# Patient Record
Sex: Female | Born: 1960 | Race: White | Hispanic: No | Smoking: Current every day smoker
Health system: Southern US, Community
[De-identification: ages and names within clinical notes are randomized; demographics above are authoritative.]

## PROBLEM LIST (undated history)

## (undated) DIAGNOSIS — K922 Gastrointestinal hemorrhage, unspecified: Secondary | ICD-10-CM

## (undated) DIAGNOSIS — K635 Polyp of colon: Secondary | ICD-10-CM

## (undated) DIAGNOSIS — F1010 Alcohol abuse, uncomplicated: Secondary | ICD-10-CM

## (undated) DIAGNOSIS — I10 Essential (primary) hypertension: Secondary | ICD-10-CM

## (undated) DIAGNOSIS — F17200 Nicotine dependence, unspecified, uncomplicated: Secondary | ICD-10-CM

## (undated) DIAGNOSIS — K5790 Diverticulosis of intestine, part unspecified, without perforation or abscess without bleeding: Secondary | ICD-10-CM

---

## 2000-09-02 ENCOUNTER — Other Ambulatory Visit: Admission: RE | Admit: 2000-09-02 | Discharge: 2000-09-02 | Payer: Self-pay | Admitting: Obstetrics and Gynecology

## 2002-04-21 ENCOUNTER — Other Ambulatory Visit: Admission: RE | Admit: 2002-04-21 | Discharge: 2002-04-21 | Payer: Self-pay | Admitting: Obstetrics & Gynecology

## 2010-02-01 ENCOUNTER — Emergency Department (HOSPITAL_COMMUNITY)
Admission: EM | Admit: 2010-02-01 | Discharge: 2010-02-01 | Payer: Self-pay | Source: Home / Self Care | Admitting: Emergency Medicine

## 2011-01-29 LAB — DIFFERENTIAL
Basophils Absolute: 0.1 10*3/uL (ref 0.0–0.1)
Basophils Relative: 1 % (ref 0–1)
Eosinophils Absolute: 0.1 10*3/uL (ref 0.0–0.7)
Eosinophils Relative: 1 % (ref 0–5)

## 2011-01-29 LAB — URINALYSIS, ROUTINE W REFLEX MICROSCOPIC
Ketones, ur: 15 mg/dL — AB
Leukocytes, UA: NEGATIVE
Nitrite: NEGATIVE
Protein, ur: NEGATIVE mg/dL
Specific Gravity, Urine: 1.018 (ref 1.005–1.030)

## 2011-01-29 LAB — CBC
HCT: 38.1 % (ref 36.0–46.0)
Hemoglobin: 13.2 g/dL (ref 12.0–15.0)
MCV: 99.6 fL (ref 78.0–100.0)
Platelets: 178 10*3/uL (ref 150–400)
WBC: 8.6 10*3/uL (ref 4.0–10.5)

## 2011-01-29 LAB — BASIC METABOLIC PANEL
Chloride: 104 mEq/L (ref 96–112)
Creatinine, Ser: 0.73 mg/dL (ref 0.4–1.2)
Glucose, Bld: 98 mg/dL (ref 70–99)
Potassium: 4.2 mEq/L (ref 3.5–5.1)

## 2011-01-29 LAB — URINE MICROSCOPIC-ADD ON

## 2011-08-23 IMAGING — CT CT HEAD W/O CM
1 of 2 series · 13 of 30 positions shown, 17 images · non-contrast
Comparison: None.

CLINICAL DATA: Weakness, nausea and vomiting

CT HEAD WITHOUT CONTRAST
TECHNIQUE: Contiguous axial images were obtained from the base of
the skull through the vertex without contrast.

[Series 2: brain · axial · 0.47mm/px · z∈[+143,+264]mm · 13 of 28 slices shown, 17 images]
[im 2/28  brain]
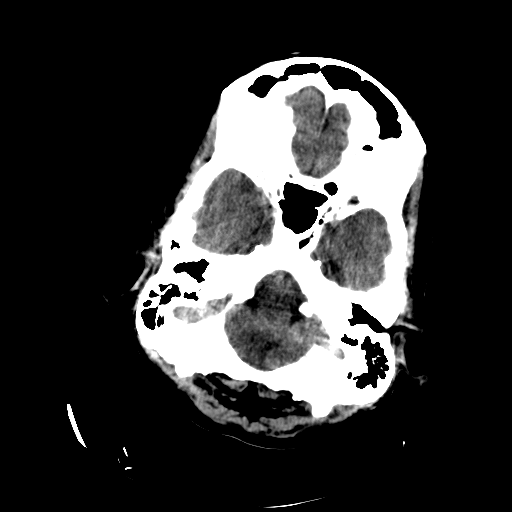
[im 2/28  bone]
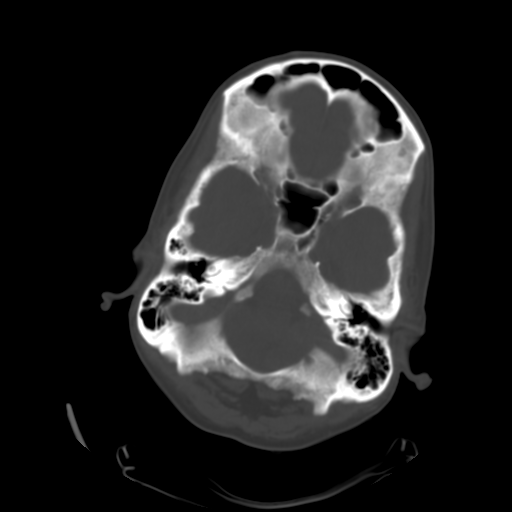
[im 4/28  brain]
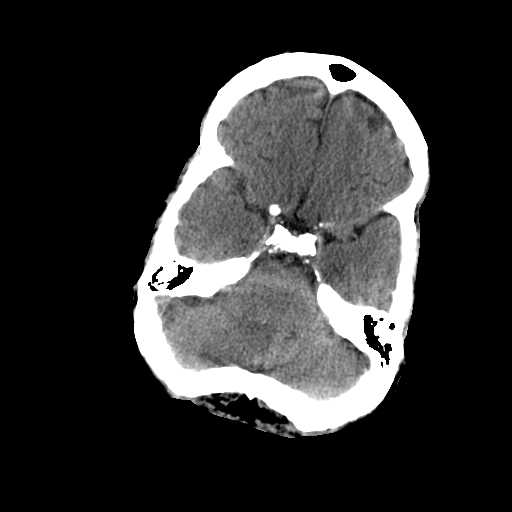
[im 6/28  brain]
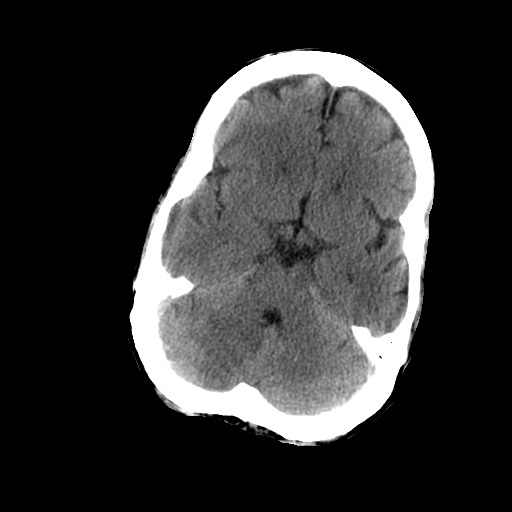
[im 8/28  brain]
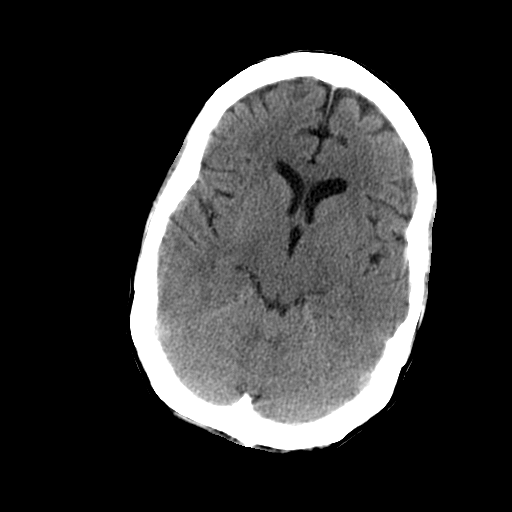
[im 10/28  brain]
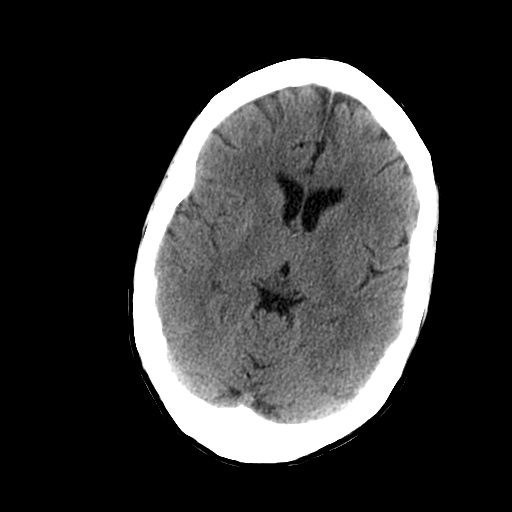
[im 10/28  bone]
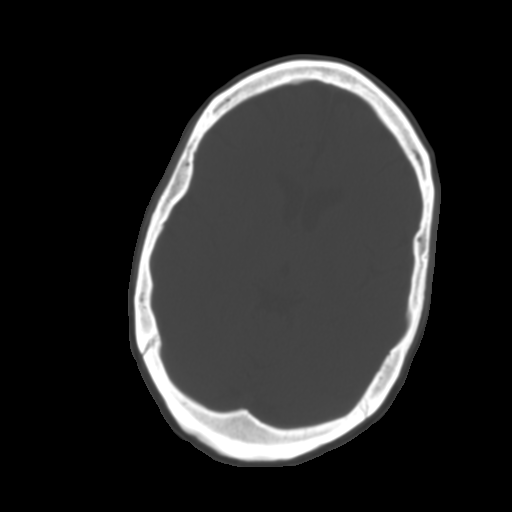
[im 12/28  brain]
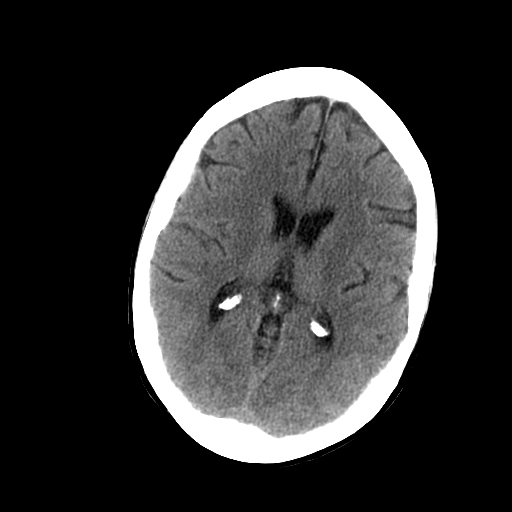
[im 14/28  brain]
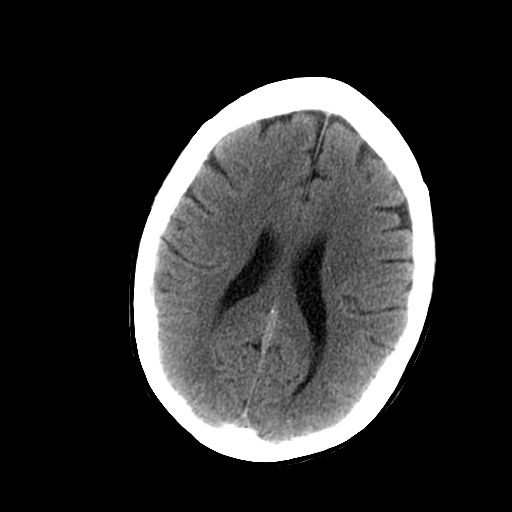
[im 16/28  brain]
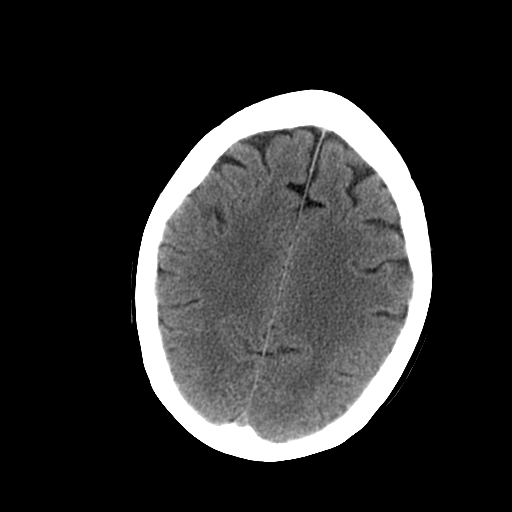
[im 18/28  brain]
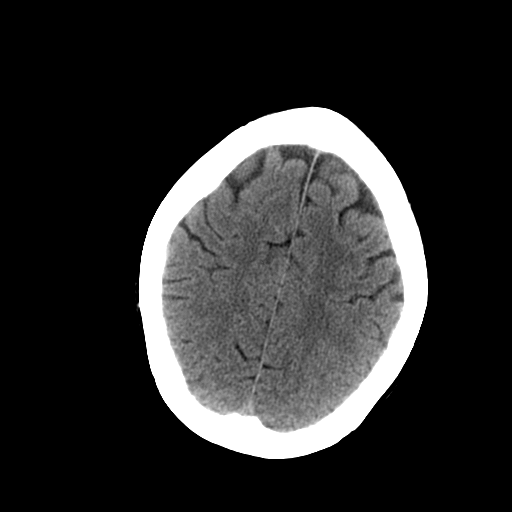
[im 18/28  bone]
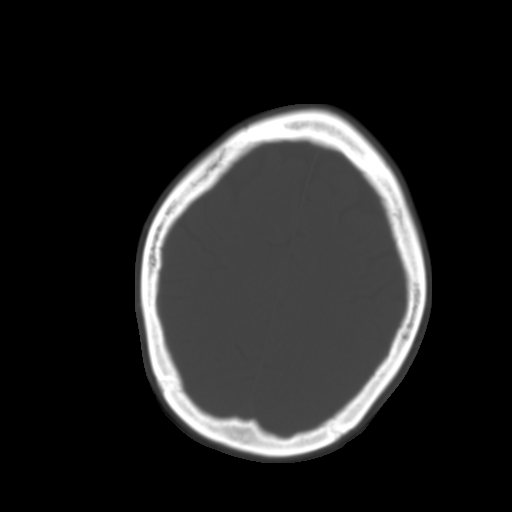
[im 20/28  brain]
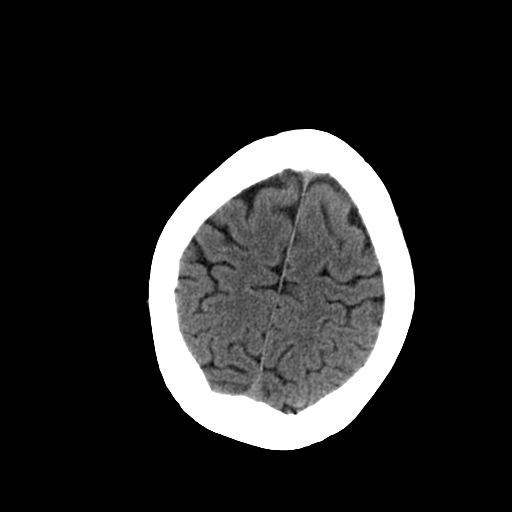
[im 22/28  brain]
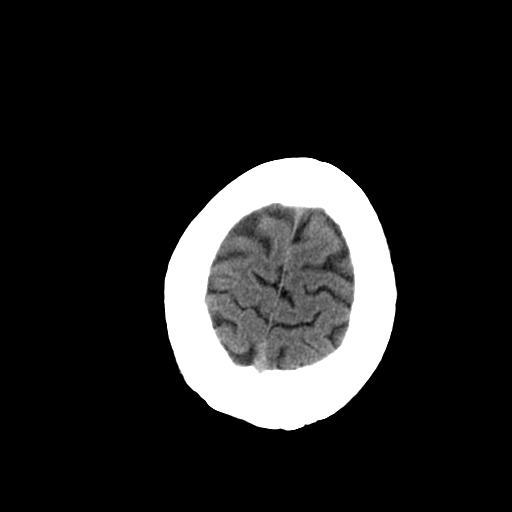
[im 24/28  brain]
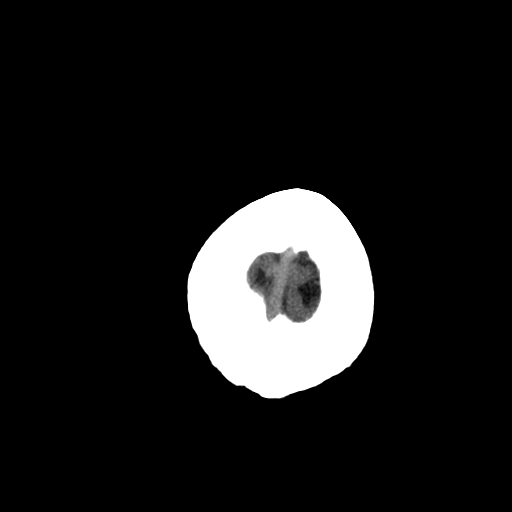
[im 26/28  brain]
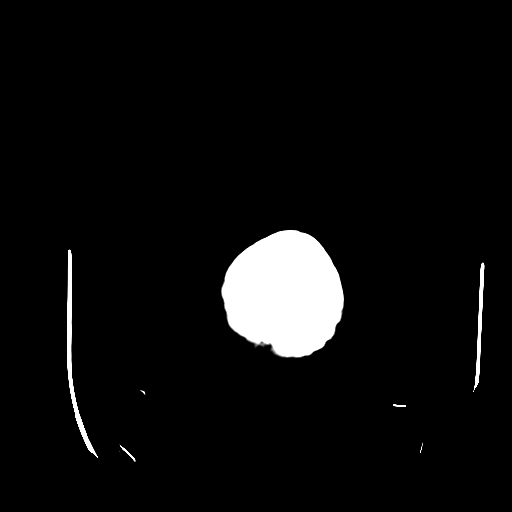
[im 26/28  bone]
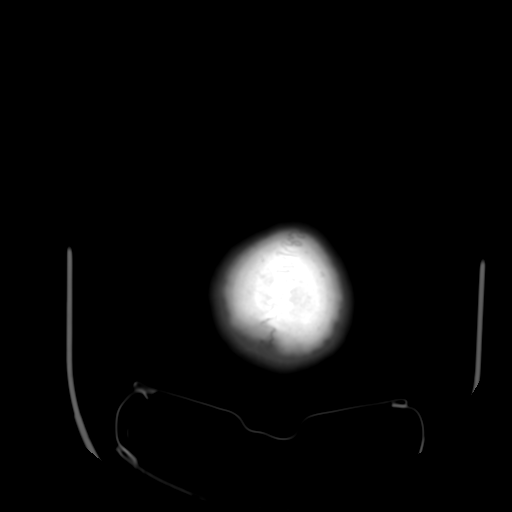

[13 of 30 positions shown; findings below may reference images not displayed]

FINDINGS: No evidence of acute intracranial hemorrhage.  The CT
evidence of acute infarction.  No midline shift or mass effect.  No
focal mass lesion.  No hydrocephalus.

Paranasal sinuses and mastoid air cells are clear.  Orbits are
normal.
IMPRESSION: Normal for age unenhanced head CT.

## 2016-06-27 DIAGNOSIS — I10 Essential (primary) hypertension: Secondary | ICD-10-CM | POA: Diagnosis not present

## 2016-06-27 DIAGNOSIS — E7800 Pure hypercholesterolemia, unspecified: Secondary | ICD-10-CM | POA: Diagnosis not present

## 2016-07-02 DIAGNOSIS — Z720 Tobacco use: Secondary | ICD-10-CM | POA: Diagnosis not present

## 2016-07-02 DIAGNOSIS — G4700 Insomnia, unspecified: Secondary | ICD-10-CM | POA: Diagnosis not present

## 2016-07-02 DIAGNOSIS — I10 Essential (primary) hypertension: Secondary | ICD-10-CM | POA: Diagnosis not present

## 2016-07-02 DIAGNOSIS — E7800 Pure hypercholesterolemia, unspecified: Secondary | ICD-10-CM | POA: Diagnosis not present

## 2017-02-20 DIAGNOSIS — I10 Essential (primary) hypertension: Secondary | ICD-10-CM | POA: Diagnosis not present

## 2017-02-20 DIAGNOSIS — E7800 Pure hypercholesterolemia, unspecified: Secondary | ICD-10-CM | POA: Diagnosis not present

## 2017-02-25 DIAGNOSIS — Z79899 Other long term (current) drug therapy: Secondary | ICD-10-CM | POA: Diagnosis not present

## 2017-02-25 DIAGNOSIS — E7800 Pure hypercholesterolemia, unspecified: Secondary | ICD-10-CM | POA: Diagnosis not present

## 2017-02-25 DIAGNOSIS — I10 Essential (primary) hypertension: Secondary | ICD-10-CM | POA: Diagnosis not present

## 2017-02-25 DIAGNOSIS — G4700 Insomnia, unspecified: Secondary | ICD-10-CM | POA: Diagnosis not present

## 2017-02-25 DIAGNOSIS — Z1389 Encounter for screening for other disorder: Secondary | ICD-10-CM | POA: Diagnosis not present

## 2017-02-25 DIAGNOSIS — F410 Panic disorder [episodic paroxysmal anxiety] without agoraphobia: Secondary | ICD-10-CM | POA: Diagnosis not present

## 2017-04-29 DIAGNOSIS — L910 Hypertrophic scar: Secondary | ICD-10-CM | POA: Diagnosis not present

## 2017-09-10 DIAGNOSIS — E7800 Pure hypercholesterolemia, unspecified: Secondary | ICD-10-CM | POA: Diagnosis not present

## 2017-09-10 DIAGNOSIS — I10 Essential (primary) hypertension: Secondary | ICD-10-CM | POA: Diagnosis not present

## 2017-09-17 DIAGNOSIS — F410 Panic disorder [episodic paroxysmal anxiety] without agoraphobia: Secondary | ICD-10-CM | POA: Diagnosis not present

## 2017-09-17 DIAGNOSIS — K219 Gastro-esophageal reflux disease without esophagitis: Secondary | ICD-10-CM | POA: Diagnosis not present

## 2017-09-17 DIAGNOSIS — E7800 Pure hypercholesterolemia, unspecified: Secondary | ICD-10-CM | POA: Diagnosis not present

## 2017-09-17 DIAGNOSIS — Z79899 Other long term (current) drug therapy: Secondary | ICD-10-CM | POA: Diagnosis not present

## 2017-09-17 DIAGNOSIS — I10 Essential (primary) hypertension: Secondary | ICD-10-CM | POA: Diagnosis not present

## 2017-10-22 DIAGNOSIS — I10 Essential (primary) hypertension: Secondary | ICD-10-CM | POA: Diagnosis not present

## 2017-12-16 DIAGNOSIS — M5432 Sciatica, left side: Secondary | ICD-10-CM | POA: Diagnosis not present

## 2018-02-27 DIAGNOSIS — M5432 Sciatica, left side: Secondary | ICD-10-CM | POA: Diagnosis not present

## 2018-02-27 DIAGNOSIS — Z1331 Encounter for screening for depression: Secondary | ICD-10-CM | POA: Diagnosis not present

## 2018-02-27 DIAGNOSIS — Z87891 Personal history of nicotine dependence: Secondary | ICD-10-CM | POA: Diagnosis not present

## 2018-02-27 DIAGNOSIS — Z6820 Body mass index (BMI) 20.0-20.9, adult: Secondary | ICD-10-CM | POA: Diagnosis not present

## 2018-03-10 DIAGNOSIS — R262 Difficulty in walking, not elsewhere classified: Secondary | ICD-10-CM | POA: Diagnosis not present

## 2018-03-10 DIAGNOSIS — M6281 Muscle weakness (generalized): Secondary | ICD-10-CM | POA: Diagnosis not present

## 2018-03-10 DIAGNOSIS — M5417 Radiculopathy, lumbosacral region: Secondary | ICD-10-CM | POA: Diagnosis not present

## 2018-03-18 DIAGNOSIS — I10 Essential (primary) hypertension: Secondary | ICD-10-CM | POA: Diagnosis not present

## 2018-03-18 DIAGNOSIS — E7800 Pure hypercholesterolemia, unspecified: Secondary | ICD-10-CM | POA: Diagnosis not present

## 2018-03-27 DIAGNOSIS — I10 Essential (primary) hypertension: Secondary | ICD-10-CM | POA: Diagnosis not present

## 2018-03-27 DIAGNOSIS — E7800 Pure hypercholesterolemia, unspecified: Secondary | ICD-10-CM | POA: Diagnosis not present

## 2018-03-27 DIAGNOSIS — F410 Panic disorder [episodic paroxysmal anxiety] without agoraphobia: Secondary | ICD-10-CM | POA: Diagnosis not present

## 2018-03-27 DIAGNOSIS — Z79899 Other long term (current) drug therapy: Secondary | ICD-10-CM | POA: Diagnosis not present

## 2018-03-27 DIAGNOSIS — G4700 Insomnia, unspecified: Secondary | ICD-10-CM | POA: Diagnosis not present

## 2018-09-26 DIAGNOSIS — E7800 Pure hypercholesterolemia, unspecified: Secondary | ICD-10-CM | POA: Diagnosis not present

## 2018-09-26 DIAGNOSIS — I10 Essential (primary) hypertension: Secondary | ICD-10-CM | POA: Diagnosis not present

## 2018-09-30 DIAGNOSIS — G4700 Insomnia, unspecified: Secondary | ICD-10-CM | POA: Diagnosis not present

## 2018-09-30 DIAGNOSIS — I10 Essential (primary) hypertension: Secondary | ICD-10-CM | POA: Diagnosis not present

## 2018-09-30 DIAGNOSIS — F410 Panic disorder [episodic paroxysmal anxiety] without agoraphobia: Secondary | ICD-10-CM | POA: Diagnosis not present

## 2018-09-30 DIAGNOSIS — E7800 Pure hypercholesterolemia, unspecified: Secondary | ICD-10-CM | POA: Diagnosis not present

## 2022-01-22 DIAGNOSIS — Z23 Encounter for immunization: Secondary | ICD-10-CM | POA: Diagnosis not present

## 2022-01-22 DIAGNOSIS — G4700 Insomnia, unspecified: Secondary | ICD-10-CM | POA: Diagnosis not present

## 2022-01-22 DIAGNOSIS — Z6821 Body mass index (BMI) 21.0-21.9, adult: Secondary | ICD-10-CM | POA: Diagnosis not present

## 2022-01-22 DIAGNOSIS — I10 Essential (primary) hypertension: Secondary | ICD-10-CM | POA: Diagnosis not present

## 2022-01-22 DIAGNOSIS — Z87891 Personal history of nicotine dependence: Secondary | ICD-10-CM | POA: Diagnosis not present

## 2022-05-13 ENCOUNTER — Observation Stay (HOSPITAL_COMMUNITY)
Admission: EM | Admit: 2022-05-13 | Discharge: 2022-05-15 | Disposition: A | Discharge status: Home / Self Care | Payer: 59 | Attending: Internal Medicine | Admitting: Internal Medicine

## 2022-05-13 ENCOUNTER — Inpatient Hospital Stay (HOSPITAL_COMMUNITY): Payer: 59

## 2022-05-13 DIAGNOSIS — R9431 Abnormal electrocardiogram [ECG] [EKG]: Secondary | ICD-10-CM | POA: Diagnosis not present

## 2022-05-13 DIAGNOSIS — W01198A Fall on same level from slipping, tripping and stumbling with subsequent striking against other object, initial encounter: Secondary | ICD-10-CM | POA: Diagnosis not present

## 2022-05-13 DIAGNOSIS — K921 Melena: Secondary | ICD-10-CM | POA: Insufficient documentation

## 2022-05-13 DIAGNOSIS — R4182 Altered mental status, unspecified: Secondary | ICD-10-CM | POA: Diagnosis not present

## 2022-05-13 DIAGNOSIS — K635 Polyp of colon: Secondary | ICD-10-CM | POA: Insufficient documentation

## 2022-05-13 DIAGNOSIS — S0101XA Laceration without foreign body of scalp, initial encounter: Secondary | ICD-10-CM | POA: Diagnosis not present

## 2022-05-13 DIAGNOSIS — R55 Syncope and collapse: Secondary | ICD-10-CM | POA: Diagnosis not present

## 2022-05-13 DIAGNOSIS — I10 Essential (primary) hypertension: Secondary | ICD-10-CM | POA: Diagnosis not present

## 2022-05-13 DIAGNOSIS — D123 Benign neoplasm of transverse colon: Secondary | ICD-10-CM

## 2022-05-13 DIAGNOSIS — K922 Gastrointestinal hemorrhage, unspecified: Secondary | ICD-10-CM | POA: Diagnosis present

## 2022-05-13 DIAGNOSIS — D62 Acute posthemorrhagic anemia: Secondary | ICD-10-CM | POA: Insufficient documentation

## 2022-05-13 DIAGNOSIS — R58 Hemorrhage, not elsewhere classified: Secondary | ICD-10-CM | POA: Diagnosis not present

## 2022-05-13 DIAGNOSIS — F17210 Nicotine dependence, cigarettes, uncomplicated: Secondary | ICD-10-CM | POA: Diagnosis not present

## 2022-05-13 DIAGNOSIS — K5731 Diverticulosis of large intestine without perforation or abscess with bleeding: Secondary | ICD-10-CM | POA: Diagnosis not present

## 2022-05-13 DIAGNOSIS — R69 Illness, unspecified: Secondary | ICD-10-CM | POA: Diagnosis not present

## 2022-05-13 DIAGNOSIS — Z720 Tobacco use: Secondary | ICD-10-CM

## 2022-05-13 DIAGNOSIS — E871 Hypo-osmolality and hyponatremia: Secondary | ICD-10-CM | POA: Insufficient documentation

## 2022-05-13 DIAGNOSIS — R0902 Hypoxemia: Secondary | ICD-10-CM | POA: Diagnosis not present

## 2022-05-13 DIAGNOSIS — W19XXXA Unspecified fall, initial encounter: Secondary | ICD-10-CM | POA: Diagnosis not present

## 2022-05-13 DIAGNOSIS — F1010 Alcohol abuse, uncomplicated: Secondary | ICD-10-CM

## 2022-05-13 DIAGNOSIS — K5730 Diverticulosis of large intestine without perforation or abscess without bleeding: Secondary | ICD-10-CM

## 2022-05-13 DIAGNOSIS — R1084 Generalized abdominal pain: Secondary | ICD-10-CM | POA: Diagnosis not present

## 2022-05-13 LAB — CBC WITH DIFFERENTIAL/PLATELET
Abs Immature Granulocytes: 0.07 10*3/uL (ref 0.00–0.07)
Basophils Absolute: 0.1 10*3/uL (ref 0.0–0.1)
Basophils Relative: 1 %
Eosinophils Absolute: 0.3 10*3/uL (ref 0.0–0.5)
Eosinophils Relative: 3 %
HCT: 25.1 % — ABNORMAL LOW (ref 36.0–46.0)
Hemoglobin: 8.7 g/dL — ABNORMAL LOW (ref 12.0–15.0)
Immature Granulocytes: 1 %
Lymphocytes Relative: 9 %
Lymphs Abs: 0.8 10*3/uL (ref 0.7–4.0)
MCH: 33.6 pg (ref 26.0–34.0)
MCHC: 34.7 g/dL (ref 30.0–36.0)
MCV: 96.9 fL (ref 80.0–100.0)
Monocytes Absolute: 0.4 10*3/uL (ref 0.1–1.0)
Monocytes Relative: 5 %
Neutro Abs: 6.8 10*3/uL (ref 1.7–7.7)
Neutrophils Relative %: 81 %
Platelets: 206 10*3/uL (ref 150–400)
RBC: 2.59 MIL/uL — ABNORMAL LOW (ref 3.87–5.11)
RDW: 13.2 % (ref 11.5–15.5)
WBC: 8.4 10*3/uL (ref 4.0–10.5)
nRBC: 0 % (ref 0.0–0.2)

## 2022-05-13 LAB — BASIC METABOLIC PANEL
Anion gap: 12 (ref 5–15)
BUN: 26 mg/dL — ABNORMAL HIGH (ref 8–23)
CO2: 19 mmol/L — ABNORMAL LOW (ref 22–32)
Calcium: 8.6 mg/dL — ABNORMAL LOW (ref 8.9–10.3)
Chloride: 91 mmol/L — ABNORMAL LOW (ref 98–111)
Creatinine, Ser: 1.07 mg/dL — ABNORMAL HIGH (ref 0.44–1.00)
GFR, Estimated: 59 mL/min — ABNORMAL LOW (ref >60)
Glucose, Bld: 110 mg/dL — ABNORMAL HIGH (ref 70–99)
Potassium: 3.5 mmol/L (ref 3.5–5.1)
Sodium: 122 mmol/L — ABNORMAL LOW (ref 135–145)

## 2022-05-13 LAB — IRON AND TIBC
Iron: 30 ug/dL (ref 28–170)
Saturation Ratios: 12 % (ref 10.4–31.8)
TIBC: 244 ug/dL — ABNORMAL LOW (ref 250–450)
UIBC: 214 ug/dL

## 2022-05-13 LAB — I-STAT CHEM 8, ED
BUN: 25 mg/dL — ABNORMAL HIGH (ref 8–23)
Calcium, Ion: 1.12 mmol/L — ABNORMAL LOW (ref 1.15–1.40)
Chloride: 91 mmol/L — ABNORMAL LOW (ref 98–111)
Creatinine, Ser: 1.1 mg/dL — ABNORMAL HIGH (ref 0.44–1.00)
Glucose, Bld: 91 mg/dL (ref 70–99)
HCT: 25 % — ABNORMAL LOW (ref 36.0–46.0)
Hemoglobin: 8.5 g/dL — ABNORMAL LOW (ref 12.0–15.0)
Potassium: 3.4 mmol/L — ABNORMAL LOW (ref 3.5–5.1)
Sodium: 121 mmol/L — ABNORMAL LOW (ref 135–145)
TCO2: 17 mmol/L — ABNORMAL LOW (ref 22–32)

## 2022-05-13 LAB — COMPREHENSIVE METABOLIC PANEL
ALT: 14 U/L (ref 0–44)
AST: 20 U/L (ref 15–41)
Albumin: 2.9 g/dL — ABNORMAL LOW (ref 3.5–5.0)
Alkaline Phosphatase: 72 U/L (ref 38–126)
Anion gap: 15 (ref 5–15)
BUN: 28 mg/dL — ABNORMAL HIGH (ref 8–23)
CO2: 15 mmol/L — ABNORMAL LOW (ref 22–32)
Calcium: 8.2 mg/dL — ABNORMAL LOW (ref 8.9–10.3)
Chloride: 91 mmol/L — ABNORMAL LOW (ref 98–111)
Creatinine, Ser: 1.12 mg/dL — ABNORMAL HIGH (ref 0.44–1.00)
GFR, Estimated: 56 mL/min — ABNORMAL LOW (ref >60)
Glucose, Bld: 96 mg/dL (ref 70–99)
Potassium: 3.1 mmol/L — ABNORMAL LOW (ref 3.5–5.1)
Sodium: 121 mmol/L — ABNORMAL LOW (ref 135–145)
Total Bilirubin: 0.5 mg/dL (ref 0.3–1.2)
Total Protein: 5.1 g/dL — ABNORMAL LOW (ref 6.5–8.1)

## 2022-05-13 LAB — HEMOGLOBIN A1C
Hgb A1c MFr Bld: 6 % — ABNORMAL HIGH (ref 4.8–5.6)
Mean Plasma Glucose: 125.5 mg/dL

## 2022-05-13 LAB — TYPE AND SCREEN
ABO/RH(D): A POS
Antibody Screen: NEGATIVE

## 2022-05-13 LAB — HEMOGLOBIN: Hemoglobin: 8.8 g/dL — ABNORMAL LOW (ref 12.0–15.0)

## 2022-05-13 LAB — POC OCCULT BLOOD, ED: Fecal Occult Bld: POSITIVE — AB

## 2022-05-13 LAB — OSMOLALITY: Osmolality: 263 mOsm/kg — ABNORMAL LOW (ref 275–295)

## 2022-05-13 LAB — ABO/RH: ABO/RH(D): A POS

## 2022-05-13 MED ORDER — FENTANYL CITRATE PF 50 MCG/ML IJ SOSY: 12.5000 ug | PREFILLED_SYRINGE | INTRAMUSCULAR | Status: DC | PRN

## 2022-05-13 MED ORDER — ONDANSETRON HCL 4 MG PO TABS: 4.0000 mg | ORAL_TABLET | Freq: Four times a day (QID) | ORAL | Status: DC | PRN

## 2022-05-13 MED ORDER — PANTOPRAZOLE SODIUM 40 MG IV SOLR
40.0000 mg | Freq: Two times a day (BID) | INTRAVENOUS | Status: DC
  Administered 2022-05-13 – 2022-05-14 (×2): 40 mg via INTRAVENOUS
  Filled 2022-05-13 (×2): qty 10

## 2022-05-13 MED ORDER — ACETAMINOPHEN 325 MG PO TABS
650.0000 mg | ORAL_TABLET | Freq: Four times a day (QID) | ORAL | Status: DC | PRN
  Administered 2022-05-14: 650 mg via ORAL
  Filled 2022-05-13: qty 2

## 2022-05-13 MED ORDER — SODIUM CHLORIDE 0.9 % IV BOLUS
1000.0000 mL | Freq: Once | INTRAVENOUS | Status: AC
  Administered 2022-05-13: 1000 mL via INTRAVENOUS

## 2022-05-13 MED ORDER — POTASSIUM CHLORIDE 10 MEQ/100ML IV SOLN
10.0000 meq | INTRAVENOUS | Status: AC
  Administered 2022-05-13 (×2): 10 meq via INTRAVENOUS
  Filled 2022-05-13 (×2): qty 100

## 2022-05-13 MED ORDER — ONDANSETRON HCL 4 MG/2ML IJ SOLN: 4.0000 mg | Freq: Four times a day (QID) | INTRAMUSCULAR | Status: DC | PRN

## 2022-05-13 MED ORDER — SODIUM CHLORIDE 0.9 % IV SOLN: INTRAVENOUS | Status: AC

## 2022-05-13 MED ORDER — ALBUTEROL SULFATE (2.5 MG/3ML) 0.083% IN NEBU: 2.5000 mg | INHALATION_SOLUTION | RESPIRATORY_TRACT | Status: DC | PRN

## 2022-05-13 MED ORDER — ACETAMINOPHEN 650 MG RE SUPP: 650.0000 mg | Freq: Four times a day (QID) | RECTAL | Status: DC | PRN

## 2022-05-13 NOTE — H&P (Signed)
History and Physical    Ariel Bridges:[id number] DOB: 02-Sep-1961 DOA: 05/13/2022  PCP: [facility], [facility]  Patient coming from: home  I have personally briefly reviewed patient's old medical records in [place]  Chief Complaint: brbpr  HPI: Ariel Bridges is a 61 y.o. female with  medical history significant for HTN who presents to ED with 3 episodes of BRBPR starting at around 5 pm. Patient noted associated syncope with fall with LOC and where she hit her head prior to EMS arrival. She notes no prior symptoms like this in the past. She notes no history of anticoagulation use but does take 4 bc powders daily and drink 4-6 beers daily. She notes he had  pain with these symptoms but noted lower abdominal cramping and bloating  for the last 5-6 days associated with poor appetite and decrease intake.  She notes no sob ,chest pain , n/v/dysuria/ diarrhea prior to event.. She denies cough Ariel Bridges     ED Course:  in ED Hbg 8.7 was 12 (6/13)  Dr Dorie Rank consulted notes will see in am however to be called if bleeding increases, patient has had no further episode in ed, get CTA if patient rebleeds Review of Systems: As per HPI otherwise 10 point review of systems negative.   PMH/SH HTN  ETOH abuse  Tobacco abuse   PSH Remote c-section   FH No hx of colon ca  Prior to Admission medications   Not on File    Physical Exam: Vitals:   05/13/22 2100 05/13/22 2115  BP: 121/70 118/67  Pulse: (!) 57 (!) 57  Resp: 12 11  Temp: 97.9 F (36.6 C)   TempSrc: Axillary   SpO2: 100% 100%     Vitals:   05/13/22 2100 05/13/22 2115  BP: 121/70 118/67  Pulse: (!) 57 (!) 57  Resp: 12 11  Temp: 97.9 F (36.6 C)   TempSrc: Axillary   SpO2: 100% 100%  Constitutional: NAD, calm, comfortable Eyes: PERRL, lids and conjunctivae normal ENMT: Mucous membranes are moist. Posterior pharynx clear of any exudate or lesions.Normal dentition.  Neck: normal, supple,  no masses, no thyromegaly Respiratory: clear to auscultation bilaterally, no wheezing, no crackles. Normal respiratory effort. No accessory muscle use.  Cardiovascular: Regular rate and rhythm, no murmurs / rubs / gallops. No extremity edema. 2+ pedal pulses.  Abdomen: no tenderness, no masses palpated. No hepatosplenomegaly. Bowel sounds positive.  Musculoskeletal: no clubbing / cyanosis. No joint deformity upper and lower extremities. Good ROM, no contractures. Normal muscle tone.  Skin: [posterior scalp laceration,no rashes, lesions, ulcers. No induration Neurologic: CN 2-12 grossly intact. Sensation intact, Strength 5/5 in all 4.  Psychiatric: Normal judgment and insight. Alert and oriented x 3. Normal mood.    Labs on Admission: I have personally reviewed following labs and imaging studies  CBC: Recent Labs  Lab 05/13/22 2100 05/13/22 2132  WBC 8.4  --   NEUTROABS 6.8  --   HGB 8.7* 8.5*  HCT 25.1* 25.0*  MCV 96.9  --   PLT 206  --    Basic Metabolic Panel: Recent Labs  Lab 05/13/22 2100 05/13/22 2132  NA 121* 121*  K 3.1* 3.4*  CL 91* 91*  CO2 15*  --   GLUCOSE 96 91  BUN 28* 25*  CREATININE 1.12* 1.10*  CALCIUM 8.2*  --    GFR: CrCl cannot be calculated (Unknown ideal weight.). Liver Function Tests: Recent Labs  Lab 05/13/22 2100  AST 20  ALT 14  ALKPHOS 72  BILITOT 0.5  PROT 5.1*  ALBUMIN 2.9*   No results for input(s): "LIPASE", "AMYLASE" in the last 168 hours. No results for input(s): "AMMONIA" in the last 168 hours. Coagulation Profile: No results for input(s): "INR", "PROTIME" in the last 168 hours. Cardiac Enzymes: No results for input(s): "CKTOTAL", "CKMB", "CKMBINDEX", "TROPONINI" in the last 168 hours. BNP (last 3 results) No results for input(s): "PROBNP" in the last 8760 hours. HbA1C: No results for input(s): "HGBA1C" in the last 72 hours. CBG: No results for input(s): "GLUCAP" in the last 168 hours. Lipid Profile: No results for  input(s): "CHOL", "HDL", "LDLCALC", "TRIG", "CHOLHDL", "LDLDIRECT" in the last 72 hours. Thyroid Function Tests: No results for input(s): "TSH", "T4TOTAL", "FREET4", "T3FREE", "THYROIDAB" in the last 72 hours. Anemia Panel: No results for input(s): "VITAMINB12", "FOLATE", "FERRITIN", "TIBC", "IRON", "RETICCTPCT" in the last 72 hours. Urine analysis:    Component Value Date/Time   COLORURINE YELLOW 02/01/2010 1157   APPEARANCEUR HAZY (A) 02/01/2010 1157   LABSPEC 1.018 02/01/2010 1157   PHURINE 5.0 02/01/2010 1157   GLUCOSEU NEGATIVE 02/01/2010 1157   HGBUR TRACE (A) 02/01/2010 1157   BILIRUBINUR NEGATIVE 02/01/2010 1157   KETONESUR 15 (A) 02/01/2010 1157   PROTEINUR NEGATIVE 02/01/2010 1157   UROBILINOGEN 0.2 02/01/2010 1157   NITRITE NEGATIVE 02/01/2010 1157   LEUKOCYTESUR NEGATIVE 02/01/2010 1157    Radiological Exams on Admission: No results found.  EKG: Independently reviewed.   Assessment/Plan Lower GI bleed with episode of syncope -admit to progressive care -syncope due volume loss/vasovagal -no further brbpr since admission  -hx of Goody powders and daily ETOH use  - npo  - Gi dr Loletha Carrow consulted , will see patient in am  -ivfs  - ppi bid  -cycle h/h  -transfuse if hgb<7     ETOH abuse  - place on ciwa   Hyponatremia -multifactorial, due to low volume / low solute intake /etoh use  -serum osmo, urine na  -ivfs , monitor labs   HTN -hold anti -htn medications w/gi bleed DVT prophylaxis: scd Code Status: full Family Communication: daughter at bedside Disposition Plan: patient  expected to be admitted greater than 2 midnights  Consults called: Gi dr Loletha Carrow Admission status: inpatient progressive  Clance Boll MD [facility]  If 7PM-7AM, please contact night-coverage [email or web address] Password [email or web address]  05/13/2022, 10:13 PM

## 2022-05-13 NOTE — ED Provider Notes (Signed)
[facility] EMERGENCY DEPARTMENT Provider Note   CSN: [id number] Arrival date & time: 05/13/22  2030     History  Chief Complaint  Patient presents with   GI Problem   Abdominal Pain    Ariel Bridges is a 61 y.o. female otherwise healthy here presenting with lower GI bleed.  Patient has some abdominal cramps for the last 3 days.  Had 3 episodes of bloody bowel movement since 5 PM today.  She states that it just pure blood but denies any melena.  Patient is not on any blood thinners.  She states that she never has a history of diverticulitis or diverticulosis or previous colonoscopies in the past.  She states that every time she stands up she feels lightheaded and dizzy  The history is provided by the patient.       Home Medications Prior to Admission medications   Not on File      Allergies    Patient has no known allergies.    Review of Systems   Review of Systems  Gastrointestinal:  Positive for blood in stool.  All other systems reviewed and are negative.   Physical Exam Updated Vital Signs There were no vitals taken for this visit. Physical Exam Vitals and nursing note reviewed.  Constitutional:      Comments: Pale  HENT:     Head: Normocephalic.  Eyes:     Extraocular Movements: Extraocular movements intact.     Comments: Conjunctiva pale   Cardiovascular:     Rate and Rhythm: Normal rate and regular rhythm.  Abdominal:     General: Abdomen is flat.     Palpations: Abdomen is soft.  Genitourinary:    Comments: Rectal-bright red blood per rectum.  No obvious hemorrhoids Neurological:     General: No focal deficit present.     Mental Status: She is oriented to person, place, and time.  Psychiatric:        Mood and Affect: Mood normal.        Behavior: Behavior normal.     ED Results / Procedures / Treatments   Labs (all labs ordered are listed, but only abnormal results are displayed) Labs Reviewed  CBC WITH  DIFFERENTIAL/PLATELET - Abnormal; Notable for the following components:      Result Value   RBC 2.59 (*)    Hemoglobin 8.7 (*)    HCT 25.1 (*)    All other components within normal limits  POC OCCULT BLOOD, ED - Abnormal; Notable for the following components:   Fecal Occult Bld POSITIVE (*)    All other components within normal limits  COMPREHENSIVE METABOLIC PANEL  I-STAT CHEM 8, ED    EKG EKG Interpretation  Date/Time:  Sunday May 13 2022 20:55:24 EDT Ventricular Rate:  56 PR Interval:  246 QRS Duration: 97 QT Interval:  489 QTC Calculation: 472 R Axis:   69 Text Interpretation: Sinus rhythm Prolonged PR interval Minimal ST elevation, inferior leads Baseline wander in lead(s) I III aVL No previous ECGs available Confirmed by Wandra Arthurs [id number] on 05/13/2022 9:12:09 PM  Radiology No results found.  Procedures Procedures    Medications Ordered in ED Medications  sodium chloride 0.9 % bolus 1,000 mL (1,000 mLs Intravenous New Bag/Given 05/13/22 2112)    ED Course/ Medical Decision Making/ A&P                           Medical Decision  Making Ariel Bridges is a 61 y.o. female here with bright red blood per rectum.  She had 3 episodes of bright red blood.  Patient is not hypotensive.  Plan to get CBC and CMP and type and screen and likely will need admission and consult GI  10:09 PM Hemoglobin is 8.7 and previously was 13.  BUN is 25 and creatinine is 1.1.  Concern for possible diverticular bleed.  I secure chat with Dr. Loletha Carrow from GI.  He recommend hospitalist admission and he will see patient as a consult.  We will keep n.p.o.  If patient has another episode of blood in her stool, she will need a CTA.  Problems Addressed: Lower GI bleed: acute illness or injury  Amount and/or Complexity of Data Reviewed Labs: ordered. Decision-making details documented in ED Course.  Risk Decision regarding hospitalization.    Final Clinical Impression(s) / ED  Diagnoses Final diagnoses:  None    Rx / DC Orders ED Discharge Orders     None         Drenda Freeze, MD 05/13/22 2213

## 2022-05-13 NOTE — ED Triage Notes (Signed)
Per pt reported lower abdominal pain starting 3 days ago, today around 5pm had a sharp lower abdominal pain and multiple bowel movements full of blood only since 5pm.

## 2022-05-13 NOTE — ED Notes (Signed)
Patient reports having a syncopal episode before 911 called and could not recall if she hurt her head. RN saw an open laceration on the back of her head, its not bleeding currently but there is dry blood on covers. MD made aware.

## 2022-05-14 DIAGNOSIS — K922 Gastrointestinal hemorrhage, unspecified: Secondary | ICD-10-CM | POA: Diagnosis not present

## 2022-05-14 DIAGNOSIS — F1010 Alcohol abuse, uncomplicated: Secondary | ICD-10-CM

## 2022-05-14 DIAGNOSIS — I10 Essential (primary) hypertension: Secondary | ICD-10-CM

## 2022-05-14 DIAGNOSIS — D125 Benign neoplasm of sigmoid colon: Secondary | ICD-10-CM | POA: Diagnosis not present

## 2022-05-14 DIAGNOSIS — R69 Illness, unspecified: Secondary | ICD-10-CM | POA: Diagnosis not present

## 2022-05-14 DIAGNOSIS — D62 Acute posthemorrhagic anemia: Secondary | ICD-10-CM | POA: Insufficient documentation

## 2022-05-14 DIAGNOSIS — Z720 Tobacco use: Secondary | ICD-10-CM

## 2022-05-14 DIAGNOSIS — D123 Benign neoplasm of transverse colon: Secondary | ICD-10-CM | POA: Diagnosis not present

## 2022-05-14 LAB — COMPREHENSIVE METABOLIC PANEL
ALT: 14 U/L (ref 0–44)
AST: 20 U/L (ref 15–41)
Albumin: 2.9 g/dL — ABNORMAL LOW (ref 3.5–5.0)
Alkaline Phosphatase: 79 U/L (ref 38–126)
Anion gap: 12 (ref 5–15)
BUN: 24 mg/dL — ABNORMAL HIGH (ref 8–23)
CO2: 16 mmol/L — ABNORMAL LOW (ref 22–32)
Calcium: 8.8 mg/dL — ABNORMAL LOW (ref 8.9–10.3)
Chloride: 99 mmol/L (ref 98–111)
Creatinine, Ser: 0.88 mg/dL (ref 0.44–1.00)
GFR, Estimated: >60 mL/min (ref >60)
Glucose, Bld: 84 mg/dL (ref 70–99)
Potassium: 4.3 mmol/L (ref 3.5–5.1)
Sodium: 127 mmol/L — ABNORMAL LOW (ref 135–145)
Total Bilirubin: 0.5 mg/dL (ref 0.3–1.2)
Total Protein: 5.3 g/dL — ABNORMAL LOW (ref 6.5–8.1)

## 2022-05-14 LAB — HIV ANTIBODY (ROUTINE TESTING W REFLEX): HIV Screen 4th Generation wRfx: NONREACTIVE

## 2022-05-14 LAB — FERRITIN: Ferritin: 92 ng/mL (ref 11–307)

## 2022-05-14 LAB — CBC
HCT: 25.6 % — ABNORMAL LOW (ref 36.0–46.0)
Hemoglobin: 8.7 g/dL — ABNORMAL LOW (ref 12.0–15.0)
MCH: 33.6 pg (ref 26.0–34.0)
MCHC: 34 g/dL (ref 30.0–36.0)
MCV: 98.8 fL (ref 80.0–100.0)
Platelets: 204 10*3/uL (ref 150–400)
RBC: 2.59 MIL/uL — ABNORMAL LOW (ref 3.87–5.11)
RDW: 13.4 % (ref 11.5–15.5)
WBC: 7.7 10*3/uL (ref 4.0–10.5)
nRBC: 0 % (ref 0.0–0.2)

## 2022-05-14 LAB — HEMOGLOBIN AND HEMATOCRIT, BLOOD
HCT: 27.9 % — ABNORMAL LOW (ref 36.0–46.0)
Hemoglobin: 9.8 g/dL — ABNORMAL LOW (ref 12.0–15.0)

## 2022-05-14 LAB — CBG MONITORING, ED: Glucose-Capillary: 89 mg/dL (ref 70–99)

## 2022-05-14 LAB — VITAMIN B12: Vitamin B-12: 1225 pg/mL — ABNORMAL HIGH (ref 180–914)

## 2022-05-14 LAB — SODIUM, URINE, RANDOM: Sodium, Ur: 50 mmol/L

## 2022-05-14 LAB — FOLATE: Folate: 32.5 ng/mL (ref >5.9)

## 2022-05-14 LAB — TSH: TSH: 2.163 u[IU]/mL (ref 0.350–4.500)

## 2022-05-14 MED ORDER — LIDOCAINE-EPINEPHRINE-TETRACAINE (LET) TOPICAL GEL
3.0000 mL | Freq: Once | TOPICAL | Status: AC
  Administered 2022-05-14: 3 mL via TOPICAL
  Filled 2022-05-14: qty 3

## 2022-05-14 MED ORDER — METOCLOPRAMIDE HCL 5 MG/ML IJ SOLN
10.0000 mg | Freq: Four times a day (QID) | INTRAMUSCULAR | Status: AC
  Administered 2022-05-14 (×2): 10 mg via INTRAVENOUS
  Filled 2022-05-14 (×2): qty 2

## 2022-05-14 MED ORDER — PEG-KCL-NACL-NASULF-NA ASC-C 100 G PO SOLR
0.5000 | Freq: Once | ORAL | Status: AC
  Administered 2022-05-14: 100 g via ORAL
  Filled 2022-05-14: qty 1

## 2022-05-14 MED ORDER — PEG-KCL-NACL-NASULF-NA ASC-C 100 G PO SOLR: 1.0000 | Freq: Once | ORAL | Status: DC

## 2022-05-14 MED ORDER — PEG-KCL-NACL-NASULF-NA ASC-C 100 G PO SOLR
0.5000 | Freq: Once | ORAL | Status: AC
  Administered 2022-05-14: 100 g via ORAL

## 2022-05-14 MED ORDER — BISACODYL 5 MG PO TBEC
20.0000 mg | DELAYED_RELEASE_TABLET | Freq: Once | ORAL | Status: AC
  Administered 2022-05-14: 20 mg via ORAL
  Filled 2022-05-14: qty 4

## 2022-05-14 MED ORDER — MELATONIN 3 MG PO TABS
3.0000 mg | ORAL_TABLET | Freq: Every day | ORAL | Status: AC
  Administered 2022-05-14 (×2): 3 mg via ORAL
  Filled 2022-05-14 (×2): qty 1

## 2022-05-14 NOTE — ED Provider Notes (Signed)
I was asked to repair the wound on the scalp of the patient. I did not take care of any of the medical issues. I was involved in the care of the patient solely to repair the wound.   Physical Exam  BP 109/72   Pulse 64   Temp 98 F (36.7 C) (Oral)   Resp 12   SpO2 100%   Physical Exam HENT:     Head:     Comments: 2 cm laceration to posterior scalp. Straight. Well aligned. Hemostatic, small surrounding hematoma. No deformities noted.     Procedures  .Marland KitchenLaceration Repair  Date/Time: 05/14/2022 4:50 AM  Performed by: Merrily Pew, MD Authorized by: Merrily Pew, MD   Consent:    Consent obtained:  Verbal   Consent given by:  Patient   Risks discussed:  Infection, need for additional repair, nerve damage, poor wound healing, poor cosmetic result, pain, retained foreign body, tendon damage and vascular damage   Alternatives discussed:  No treatment, delayed treatment and observation Universal protocol:    Procedure explained and questions answered to patient or proxy's satisfaction: yes     Relevant documents present and verified: yes     Site/side marked: yes     Patient identity confirmed:  Hospital-assigned identification number and arm band Anesthesia:    Anesthesia method:  Topical application Laceration details:    Location:  Scalp   Scalp location:  Occipital   Length (cm):  2   Depth (mm):  5 Pre-procedure details:    Preparation:  Patient was prepped and draped in usual sterile fashion and imaging obtained to evaluate for foreign bodies Exploration:    Wound exploration: wound explored through full range of motion   Treatment:    Area cleansed with:  Saline   Amount of cleaning:  Extensive   Irrigation solution:  Sterile water   Irrigation volume:  50   Irrigation method:  Syringe   Visualized foreign bodies/material removed: no     Debridement:  None Skin repair:    Repair method:  Staples   Number of staples:  2 Approximation:    Approximation:   Close Repair type:    Repair type:  Simple Post-procedure details:    Dressing:  Open (no dressing)   Procedure completion:  Tolerated well, no immediate complications   ED Course / MDM    Medical Decision Making Amount and/or Complexity of Data Reviewed Labs: ordered.  Risk Decision regarding hospitalization.   Mental status appropriate. No deformities or other evidence of serious injury. Wound repaired as above. Needs staples removed in 10-14 days, not able to update AVS 2/2 admission status, but patient aware.        Reise Hietala, Corene Cornea, MD 05/14/22 [phone]

## 2022-05-14 NOTE — Consult Note (Addendum)
Attending physician's note   I have taken a history, reviewed the chart, and examined the patient. I performed a substantive portion of this encounter, including complete performance of at least one of the key components, in conjunction with the APP. I agree with the APP's note, impression, and recommendations with my edits.   61 year old female with medical history as outlined below presents with lower abdominal pain x5 days and acute onset hematochezia followed by lightheadedness, syncope, and fall with LOC.  No prior similar symptoms.  No previous colonoscopy or EGD.  Does take BC powders daily and drinks 6 beers/day.  No longer having any abdominal pain.  Admission evaluation notable for the following: - H/H 8.7/25.6 with MCV/RDW 99/13.  No comparison labs for review - Na 121 --> 127 - BUN/creatinine 25/1.1  --> 24/0.80 - Albumin 2.9, otherwise normal liver enzymes - Iron 30, TIBC 244, sat 12% - CT head: No acute intracranial pathology  1) Hematochezia 2) Abdominal pain (resolved) 3) Anemia (unclear if acute vs chronic given lack of comparison labs)  - Check ferritin, B12, folate - Continue serial CBC checks with PRBC transfusion as needed per protocol - Clears okay with n.p.o. at midnight - Bowel perforation this evening for planned colonoscopy tomorrow for diagnostic potentially therapeutic intent  4) Hyponatremia - Improving - Continued evaluation and treatment per primary service  5) EtOH use - CIWA protocol  The indications, risks, and benefits of colonoscopy were explained to the patient in detail. Risks include but are not limited to bleeding, perforation, adverse reaction to medications, and cardiopulmonary compromise. Sequelae include but are not limited to the possibility of surgery, prolonged hospitalization, and mortality. The patient verbalized understanding and wished to  proceed. All questions answered.   [address], DO, [place] [phone] office                                                                                   [facility] Consult: 10:45 AM 05/14/2022  LOS: 1 day    Referring Provider: Dr Si Raider  Primary Care Physician:  Practice, [facility] Primary Gastroenterologist:  unassigned,  none.      Reason for Consultation: Abdominal pain, bloody bowel movements.   HPI: Ariel Bridges is a 61 y.o. female.  Notes mention prior history hypertension, not on any antihypertensive meds.  ETOH abuse.  Smoker.  C-section x 3, 1 of these involved appendectomy.. Denies prior colonoscopy, EGDs.   Patient from [facility].  Baseline takes 4 to 6 BC powders daily "to keep going".  Drinks a sixpack of beer daily.  Beginning on 4 July, almost a week ago she developed central, lower abdominal pain and bloating along with worsening constipation.  Normally she has a decent sized bowel movement every other day but often strains to have a bowel movement.  Last week she was able to pass at most watery material with small amounts of stool.  Initially took some Imodium on 1 occasion and was using Tums.  Pain was better on Saturday when she did not go to work.  She reports that if she is at rest the pain gets much better but once she  is on her feet and moving around pain worsens.  However she is not tender to palpation.  There was no nausea or vomiting.  She went back to work on Sunday and the pain became progressive and intense.  She finally had a bowel movement and passed a moderately large amount of blood on several occasions.  She had a couple of syncopal spells, 1 before the ambulance arrived and 1 during the ambulance ride.  She hit her head at home during the syncopal spell.  Also reports generalized weakness and shortness of breath with activity which is new for her.  Has not been hypotensive or tachycardic.  Oxygen sats on  room air are excellent.  No fever. Hgb 8.5..  8.7.  Was 13.2 in 2012.  MCV 98.  Platelets normal.  WBCs normal. Na 121.. 127. BUN/creatinine 26/1.0.  LFTs normal with the exception of low albumin and low total protein. Iron 30 low normal, TIBC 244 low, iron sats 12% normal. Head CT unremarkable.  Has not had a CT of abdomen, pelvis  Initiated on Protonix 40 mg IV bid.    Has 3 children aged 24-43.  Works as a Educational psychologist.  Smokes a pack of cigarettes daily and consumes sixpack of beer daily. No family history of colorectal cancer or colitis, gastritis, ulcer disease.  No anemia or requirements for blood transfusions.   Prior to Admission medications   Not on File  Amlodipine 5 mg daily.  Lisinopril-hydrochlorothiazide 20/12.5 mg 2 p.o. daily   Scheduled Meds:  bisacodyl  20 mg Oral Once   melatonin  3 mg Oral QHS   metoCLOPramide (REGLAN) injection  10 mg Intravenous Q6H   pantoprazole (PROTONIX) IV  40 mg Intravenous Q12H   peg 3350 powder  1 kit Oral Once   Infusions:  sodium chloride 75 mL/hr at 05/14/22 0959   PRN Meds: acetaminophen **OR** acetaminophen, albuterol, fentaNYL (SUBLIMAZE) injection, ondansetron **OR** ondansetron (ZOFRAN) IV   Allergies as of 05/13/2022   (No Known Allergies)    No family history on file.  Social History   Socioeconomic History   Marital status: Divorced    Spouse name: Not on file   Number of children: Not on file   Years of education: Not on file   Highest education level: Not on file  Occupational History   Not on file  Tobacco Use   Smoking status: Not on file   Smokeless tobacco: Not on file  Substance and Sexual Activity   Alcohol use: Not on file   Drug use: Not on file   Sexual activity: Not on file  Other Topics Concern   Not on file  Social History Narrative   Not on file   Social Determinants of Health   Financial Resource Strain: Not on file  Food Insecurity: Not on file  Transportation Needs: Not on file   Physical Activity: Not on file  Stress: Not on file  Social Connections: Not on file  Intimate Partner Violence: Not on file    REVIEW OF SYSTEMS: Constitutional: Weakness, fatigue this past week. ENT:  No nose bleeds Pulm: More than usual dyspnea on exertion this past several days.  Normally not short of breath with mild to moderate activity but if she has to climb a flight of stairs she will have dyspnea.  No active cough CV:  No palpitations, no LE edema.  No angina.  No jaw/neck pain. GU:  No hematuria, no frequency GI: Per HPI. Heme: Denies unusual or  excessive bleeding in general other than this acute hematochezia starting yesterday Transfusions: None. Neuro:  No headaches, no peripheral tingling or numbness.  Dizziness, syncope as per HPI. Derm:  No itching, no rash or sores.  Endocrine:  No sweats or chills.  No polyuria or dysuria Immunization: Not queried. Travel: Not queried.   PHYSICAL EXAM: Vital signs in last 24 hours: Vitals:   05/14/22 1015 05/14/22 1030  BP: 121/80 126/85  Pulse: 70 66  Resp: 14 13  Temp:    SpO2: 99% 100%   Wt Readings from Last 3 Encounters:  No data found for Wt    General: Patient appears stated age.  She is on the thin side but not cachectic.  Cooperative, comfortable, provides excellent history. Head: Facial asymmetry or swelling.  No signs of head trauma. Eyes: Conjunctiva pink.  EOMI Ears: Not hard of hearing Nose: No congestion or discharge Mouth: Mucosa is moist, pink, clear.  Tongue midline.  Extensive dental caries. Neck: No JVD, no masses, no thyromegaly. Lungs: No labored breathing or cough.  Lungs clear with excellent breath Heart: RRR. Abdomen: Soft without tenderness.  No HSM, masses, bruits, hernias.   Rectal: Did not repeat DRE but exam per ER provider noted red blood on glove. Musc/Skeltl: No joint redness, swelling or gross deformity.  Looks a bit osteopenic. Extremities: No CCE. Neurologic: Fully alert and  oriented x3.  Moves all 4 limbs without tremor or gross deficits. Skin: No rash, no sores, no telangiectasia. Nodes: No cervical adenopathy Psych: Pleasant, cooperative.  Fluid speech.  Intake/Output from previous day: 07/09 0701 - 07/10 0700 In: 2199 [I.V.:1000; IV Piggyback:1199] Out: 400 [Blood:400] Intake/Output this shift: No intake/output data recorded.  LAB RESULTS: Recent Labs    05/13/22 2100 05/13/22 2132 05/13/22 2244 05/14/22 0446  WBC 8.4  --   --  7.7  HGB 8.7* 8.5* 8.8* 8.7*  HCT 25.1* 25.0*  --  25.6*  PLT 206  --   --  204   BMET Lab Results  Component Value Date   NA 127 (L) 05/14/2022   NA 122 (L) 05/13/2022   NA 121 (L) 05/13/2022   K 4.3 05/14/2022   K 3.5 05/13/2022   K 3.4 (L) 05/13/2022   CL 99 05/14/2022   CL 91 (L) 05/13/2022   CL 91 (L) 05/13/2022   CO2 16 (L) 05/14/2022   CO2 19 (L) 05/13/2022   CO2 15 (L) 05/13/2022   GLUCOSE 84 05/14/2022   GLUCOSE 110 (H) 05/13/2022   GLUCOSE 91 05/13/2022   BUN 24 (H) 05/14/2022   BUN 26 (H) 05/13/2022   BUN 25 (H) 05/13/2022   CREATININE 0.88 05/14/2022   CREATININE 1.07 (H) 05/13/2022   CREATININE 1.10 (H) 05/13/2022   CALCIUM 8.8 (L) 05/14/2022   CALCIUM 8.6 (L) 05/13/2022   CALCIUM 8.2 (L) 05/13/2022   LFT Recent Labs    05/13/22 2100 05/14/22 0446  PROT 5.1* 5.3*  ALBUMIN 2.9* 2.9*  AST 20 20  ALT 14 14  ALKPHOS 72 79  BILITOT 0.5 0.5   PT/INR No results found for: "INR", "PROTIME" Hepatitis Panel No results for input(s): "HEPBSAG", "HCVAB", "HEPAIGM", "HEPBIGM" in the last 72 hours. C-Diff No components found for: "CDIFF" Lipase  No results found for: "LIPASE"  Drugs of Abuse  No results found for: "LABOPIA", "COCAINSCRNUR", "LABBENZ", "AMPHETMU", "THCU", "LABBARB"   RADIOLOGY STUDIES: CT HEAD WO CONTRAST (5MM)  Result Date: 05/13/2022 CLINICAL DATA:  Head trauma, abnormal mental status (Age 62-64y) EXAM:  CT HEAD WITHOUT CONTRAST TECHNIQUE: Contiguous axial images were  obtained from the base of the skull through the vertex without intravenous contrast. RADIATION DOSE REDUCTION: This exam was performed according to the departmental dose-optimization program which includes automated exposure control, adjustment of the mA and/or kV according to patient size and/or use of iterative reconstruction technique. COMPARISON:  02/01/2010 FINDINGS: Brain: No acute intracranial abnormality. Specifically, no hemorrhage, hydrocephalus, mass lesion, acute infarction, or significant intracranial injury. Vascular: No hyperdense vessel or unexpected calcification. Skull: No acute calvarial abnormality. Sinuses/Orbits: No acute findings Other: None IMPRESSION: Normal study for age. Electronically Signed   By: Rolm Baptise M.D.   On: 05/13/2022 23:48      IMPRESSION:   Abdominal pain, bloody bowel movements.  Differential includes ischemic colitis but lack of TTP counts against this, neoplasia with temporary obstruction though she did not have nausea vomiting., hemorrhoidal bleeding again not likely to cause abdominal pain, diverticulosis/diverticular bleed also in differential..    Hyponatatremia,  improved    Hypertension, on   PLAN:     Colonoscopy tmrw.  D/w pt and she is agreeable, wanted to get int done today!!    [place] for clears.  See prep orders.    Stop IV Protonix.  Recheck sodium in the morning.   Azucena Freed  05/14/2022, 10:45 AM Phone [phone]

## 2022-05-14 NOTE — Anesthesia Preprocedure Evaluation (Signed)
Anesthesia Evaluation  Patient identified by MRN, date of birth, ID band Patient awake    Reviewed: Allergy & Precautions, NPO status , Patient's Chart, lab work & pertinent test results  Airway Mallampati: I  TM Distance: >3 FB Neck ROM: Full    Dental no notable dental hx. (+) Poor Dentition, Chipped, Missing, Dental Advisory Given   Pulmonary neg pulmonary ROS, Current Smoker,    Pulmonary exam normal breath sounds clear to auscultation       Cardiovascular hypertension, Pt. on medications Normal cardiovascular exam Rhythm:Regular Rate:Normal     Neuro/Psych negative neurological ROS  negative psych ROS   GI/Hepatic negative GI ROS, GERD  ,(+)     substance abuse  alcohol use,   Endo/Other  negative endocrine ROS  Renal/GU negative Renal ROSLab Results      Component                Value               Date                          K                        4.3                 05/14/2022                 negative genitourinary   Musculoskeletal negative musculoskeletal ROS (+)   Abdominal   Peds negative pediatric ROS (+)  Hematology negative hematology ROS (+) Blood dyscrasia, anemia , Lab Results      Component                Value               Date                          HGB                      9.8 (L)             05/14/2022                HCT                      27.9 (L)            05/14/2022                PLT                      204                 05/14/2022              Anesthesia Other Findings All: Latex  Reproductive/Obstetrics negative OB ROS                           Anesthesia Physical Anesthesia Plan  ASA: 3  Anesthesia Plan: MAC   Post-op Pain Management: Minimal or no pain anticipated   Induction: Intravenous  PONV Risk Score and Plan: Treatment may vary due to age or medical condition  Airway Management Planned: Natural Airway and Nasal  Cannula  Additional Equipment: None  Intra-op Plan:  Post-operative Plan:   Informed Consent:     Dental advisory given  Plan Discussed with: Anesthesiologist  Anesthesia Plan Comments: (Colonoscopy Abdominal pain and hematochezia)       Anesthesia Quick Evaluation

## 2022-05-14 NOTE — H&P (View-Only) (Signed)
Attending physician's note   I have taken a history, reviewed the chart, and examined the patient. I performed a substantive portion of this encounter, including complete performance of at least one of the key components, in conjunction with the APP. I agree with the APP's note, impression, and recommendations with my edits.   61 year old female with medical history as outlined below presents with lower abdominal pain x5 days and acute onset hematochezia followed by lightheadedness, syncope, and fall with LOC.  No prior similar symptoms.  No previous colonoscopy or EGD.  Does take BC powders daily and drinks 6 beers/day.  No longer having any abdominal pain.  Admission evaluation notable for the following: - H/H 8.7/25.6 with MCV/RDW 99/13.  No comparison labs for review - Na 121 --> 127 - BUN/creatinine 25/1.1  --> 24/0.80 - Albumin 2.9, otherwise normal liver enzymes - Iron 30, TIBC 244, sat 12% - CT head: No acute intracranial pathology  1) Hematochezia 2) Abdominal pain (resolved) 3) Anemia (unclear if acute vs chronic given lack of comparison labs)  - Check ferritin, B12, folate - Continue serial CBC checks with PRBC transfusion as needed per protocol - Clears okay with n.p.o. at midnight - Bowel perforation this evening for planned colonoscopy tomorrow for diagnostic potentially therapeutic intent  4) Hyponatremia - Improving - Continued evaluation and treatment per primary service  5) EtOH use - CIWA protocol  The indications, risks, and benefits of colonoscopy were explained to the patient in detail. Risks include but are not limited to bleeding, perforation, adverse reaction to medications, and cardiopulmonary compromise. Sequelae include but are not limited to the possibility of surgery, prolonged hospitalization, and mortality. The patient verbalized understanding and wished to  proceed. All questions answered.   [address] DO, [place] [phone] office                                                                                   [facility] Consult: 10:45 AM 05/14/2022  LOS: 1 day    Referring Provider: Dr Si Raider  Primary Care Physician:  Practice, [facility] Primary Gastroenterologist:  unassigned,  none.      Reason for Consultation: Abdominal pain, bloody bowel movements.   HPI: Ariel Bridges is a 61 y.o. female.  Notes mention prior history hypertension, not on any antihypertensive meds.  ETOH abuse.  Smoker.  C-section x 3, 1 of these involved appendectomy.. Denies prior colonoscopy, EGDs.   Patient from [facility]  Baseline takes 4 to 6 BC powders daily "to keep going".  Drinks a sixpack of beer daily.  Beginning on 4 July, almost a week ago she developed central, lower abdominal pain and bloating along with worsening constipation.  Normally she has a decent sized bowel movement every other day but often strains to have a bowel movement.  Last week she was able to pass at most watery material with small amounts of stool.  Initially took some Imodium on 1 occasion and was using Tums.  Pain was better on Saturday when she did not go to work.  She reports that if she is at rest the pain gets much better but once she  is on her feet and moving around pain worsens.  However she is not tender to palpation.  There was no nausea or vomiting.  She went back to work on Sunday and the pain became progressive and intense.  She finally had a bowel movement and passed a moderately large amount of blood on several occasions.  She had a couple of syncopal spells, 1 before the ambulance arrived and 1 during the ambulance ride.  She hit her head at home during the syncopal spell.  Also reports generalized weakness and shortness of breath with activity which is new for her.  Has not been hypotensive or tachycardic.  Oxygen sats on  room air are excellent.  No fever. Hgb 8.5..  8.7.  Was 13.2 in 2012.  MCV 98.  Platelets normal.  WBCs normal. Na 121.. 127. BUN/creatinine 26/1.0.  LFTs normal with the exception of low albumin and low total protein. Iron 30 low normal, TIBC 244 low, iron sats 12% normal. Head CT unremarkable.  Has not had a CT of abdomen, pelvis  Initiated on Protonix 40 mg IV bid.    Has 3 children aged 32-43.  Works as a Educational psychologist.  Smokes a pack of cigarettes daily and consumes sixpack of beer daily. No family history of colorectal cancer or colitis, gastritis, ulcer disease.  No anemia or requirements for blood transfusions.   Prior to Admission medications   Not on File  Amlodipine 5 mg daily.  Lisinopril-hydrochlorothiazide 20/12.5 mg 2 p.o. daily   Scheduled Meds:  bisacodyl  20 mg Oral Once   melatonin  3 mg Oral QHS   metoCLOPramide (REGLAN) injection  10 mg Intravenous Q6H   pantoprazole (PROTONIX) IV  40 mg Intravenous Q12H   peg 3350 powder  1 kit Oral Once   Infusions:  sodium chloride 75 mL/hr at 05/14/22 0959   PRN Meds: acetaminophen **OR** acetaminophen, albuterol, fentaNYL (SUBLIMAZE) injection, ondansetron **OR** ondansetron (ZOFRAN) IV   Allergies as of 05/13/2022   (No Known Allergies)    No family history on file.  Social History   Socioeconomic History   Marital status: Divorced    Spouse name: Not on file   Number of children: Not on file   Years of education: Not on file   Highest education level: Not on file  Occupational History   Not on file  Tobacco Use   Smoking status: Not on file   Smokeless tobacco: Not on file  Substance and Sexual Activity   Alcohol use: Not on file   Drug use: Not on file   Sexual activity: Not on file  Other Topics Concern   Not on file  Social History Narrative   Not on file   Social Determinants of Health   Financial Resource Strain: Not on file  Food Insecurity: Not on file  Transportation Needs: Not on file   Physical Activity: Not on file  Stress: Not on file  Social Connections: Not on file  Intimate Partner Violence: Not on file    REVIEW OF SYSTEMS: Constitutional: Weakness, fatigue this past week. ENT:  No nose bleeds Pulm: More than usual dyspnea on exertion this past several days.  Normally not short of breath with mild to moderate activity but if she has to climb a flight of stairs she will have dyspnea.  No active cough CV:  No palpitations, no LE edema.  No angina.  No jaw/neck pain. GU:  No hematuria, no frequency GI: Per HPI. Heme: Denies unusual or  excessive bleeding in general other than this acute hematochezia starting yesterday Transfusions: None. Neuro:  No headaches, no peripheral tingling or numbness.  Dizziness, syncope as per HPI. Derm:  No itching, no rash or sores.  Endocrine:  No sweats or chills.  No polyuria or dysuria Immunization: Not queried. Travel: Not queried.   PHYSICAL EXAM: Vital signs in last 24 hours: Vitals:   05/14/22 1015 05/14/22 1030  BP: 121/80 126/85  Pulse: 70 66  Resp: 14 13  Temp:    SpO2: 99% 100%   Wt Readings from Last 3 Encounters:  No data found for Wt    General: Patient appears stated age.  She is on the thin side but not cachectic.  Cooperative, comfortable, provides excellent history. Head: Facial asymmetry or swelling.  No signs of head trauma. Eyes: Conjunctiva pink.  EOMI Ears: Not hard of hearing Nose: No congestion or discharge Mouth: Mucosa is moist, pink, clear.  Tongue midline.  Extensive dental caries. Neck: No JVD, no masses, no thyromegaly. Lungs: No labored breathing or cough.  Lungs clear with excellent breath Heart: RRR. Abdomen: Soft without tenderness.  No HSM, masses, bruits, hernias.   Rectal: Did not repeat DRE but exam per ER provider noted red blood on glove. Musc/Skeltl: No joint redness, swelling or gross deformity.  Looks a bit osteopenic. Extremities: No CCE. Neurologic: Fully alert and  oriented x3.  Moves all 4 limbs without tremor or gross deficits. Skin: No rash, no sores, no telangiectasia. Nodes: No cervical adenopathy Psych: Pleasant, cooperative.  Fluid speech.  Intake/Output from previous day: 07/09 0701 - 07/10 0700 In: 2199 [I.V.:1000; IV Piggyback:1199] Out: 400 [Blood:400] Intake/Output this shift: No intake/output data recorded.  LAB RESULTS: Recent Labs    05/13/22 2100 05/13/22 2132 05/13/22 2244 05/14/22 0446  WBC 8.4  --   --  7.7  HGB 8.7* 8.5* 8.8* 8.7*  HCT 25.1* 25.0*  --  25.6*  PLT 206  --   --  204   BMET Lab Results  Component Value Date   NA 127 (L) 05/14/2022   NA 122 (L) 05/13/2022   NA 121 (L) 05/13/2022   K 4.3 05/14/2022   K 3.5 05/13/2022   K 3.4 (L) 05/13/2022   CL 99 05/14/2022   CL 91 (L) 05/13/2022   CL 91 (L) 05/13/2022   CO2 16 (L) 05/14/2022   CO2 19 (L) 05/13/2022   CO2 15 (L) 05/13/2022   GLUCOSE 84 05/14/2022   GLUCOSE 110 (H) 05/13/2022   GLUCOSE 91 05/13/2022   BUN 24 (H) 05/14/2022   BUN 26 (H) 05/13/2022   BUN 25 (H) 05/13/2022   CREATININE 0.88 05/14/2022   CREATININE 1.07 (H) 05/13/2022   CREATININE 1.10 (H) 05/13/2022   CALCIUM 8.8 (L) 05/14/2022   CALCIUM 8.6 (L) 05/13/2022   CALCIUM 8.2 (L) 05/13/2022   LFT Recent Labs    05/13/22 2100 05/14/22 0446  PROT 5.1* 5.3*  ALBUMIN 2.9* 2.9*  AST 20 20  ALT 14 14  ALKPHOS 72 79  BILITOT 0.5 0.5   PT/INR No results found for: "INR", "PROTIME" Hepatitis Panel No results for input(s): "HEPBSAG", "HCVAB", "HEPAIGM", "HEPBIGM" in the last 72 hours. C-Diff No components found for: "CDIFF" Lipase  No results found for: "LIPASE"  Drugs of Abuse  No results found for: "LABOPIA", "COCAINSCRNUR", "LABBENZ", "AMPHETMU", "THCU", "LABBARB"   RADIOLOGY STUDIES: CT HEAD WO CONTRAST (5MM)  Result Date: 05/13/2022 CLINICAL DATA:  Head trauma, abnormal mental status (Age 55-64y) EXAM:  CT HEAD WITHOUT CONTRAST TECHNIQUE: Contiguous axial images were  obtained from the base of the skull through the vertex without intravenous contrast. RADIATION DOSE REDUCTION: This exam was performed according to the departmental dose-optimization program which includes automated exposure control, adjustment of the mA and/or kV according to patient size and/or use of iterative reconstruction technique. COMPARISON:  02/01/2010 FINDINGS: Brain: No acute intracranial abnormality. Specifically, no hemorrhage, hydrocephalus, mass lesion, acute infarction, or significant intracranial injury. Vascular: No hyperdense vessel or unexpected calcification. Skull: No acute calvarial abnormality. Sinuses/Orbits: No acute findings Other: None IMPRESSION: Normal study for age. Electronically Signed   By: Rolm Baptise M.D.   On: 05/13/2022 23:48      IMPRESSION:   Abdominal pain, bloody bowel movements.  Differential includes ischemic colitis but lack of TTP counts against this, neoplasia with temporary obstruction though she did not have nausea vomiting., hemorrhoidal bleeding again not likely to cause abdominal pain, diverticulosis/diverticular bleed also in differential..    Hyponatatremia,  improved    Hypertension, on   PLAN:     Colonoscopy tmrw.  D/w pt and she is agreeable, wanted to get int done today!!    [place] for clears.  See prep orders.    Stop IV Protonix.  Recheck sodium in the morning.   Azucena Freed  05/14/2022, 10:45 AM Phone [phone]

## 2022-05-14 NOTE — Progress Notes (Addendum)
PROGRESS NOTE    [name]  YHO:[id number] DOB: [birth year] DOA: 05/13/2022 PCP: Practice, [facility]  Outpatient Specialists: none    Brief Narrative:   From admission h and p Ariel Bridges is a 61 y.o. female with  medical history significant for HTN who presents to ED with 3 episodes of BRBPR starting at around 5 pm. Patient noted associated syncope with fall with LOC and where she hit her head prior to EMS arrival. She notes no prior symptoms like this in the past. She notes no history of anticoagulation use but does take 4 bc powders daily and drink 4-6 beers daily. She notes he had  pain with these symptoms but noted lower abdominal cramping and bloating  for the last 5-6 days associated with poor appetite and decrease intake.  She notes no sob ,chest pain , n/v/dysuria/ diarrhea prior to event.. She denies cough Ariel Bridges   Assessment & Plan:   Principal Problem:   Lower GI bleed Active Problems:   Tobacco abuse   Alcohol abuse   Essential hypertension  # GI bleed One episode of large volume hematochezia yesterday. Mild abdominal pain. Hemodynamically stable. Hgb stable in 8s, no recent available for comparison. No hx of colonoscopy. Takes nsaids several times a day (bc powders). Also a heavy drinker - trend hgb, transfuse prn - GI to see - continue PPI  # Alcohol abuse No signs withdrawal - monitor on ciwa - f/u hcv  # Hyponatremia Na 121 on arrival, no prior available for comparison. Improved to 127 this morning with fluids. Etoh and thiazide likely the culprits. Tsh wnl - f/u urine osm, am cortisol - continue fluids - would plan on holding hctz at d/c  # HTN Here normotensive. Patient's home hctz likely contributes to hyponatremia - hold home meds in setting of GI bleeding and normotension  # Tobacco abuse Declines patch    DVT prophylaxis: SCDs Code Status: ful Family Communication: none @ bedside  Level of care:  Progressive Status is: Inpatient Remains inpatient appropriate because: severity of illness    Consultants:  GI  Procedures: none  Antimicrobials:  none    Subjective: This morning abd pain mostly resolved. No bleeding overnight. No vomiting  Objective: Vitals:   05/14/22 0600 05/14/22 0800 05/14/22 0815 05/14/22 0900  BP: 127/80 111/85 115/78 125/77  Pulse: 71 69 66 66  Resp: '10 10 11 13  '$ Temp:      TempSrc:      SpO2: 98% 96% 97% 97%    Intake/Output Summary (Last 24 hours) at 05/14/2022 1007 Last data filed at 05/14/2022 0058 Gross per 24 hour  Intake 2199 ml  Output 400 ml  Net 1799 ml   There were no vitals filed for this visit.  Examination:  General exam: Appears calm and comfortable  Respiratory system: Clear to auscultation. Respiratory effort normal. Cardiovascular system: S1 & S2 heard, RRR. No JVD, murmurs, rubs, gallops or clicks. No pedal edema. Gastrointestinal system: Abdomen is nondistended, soft and nontender. No organomegaly or masses felt. Normal bowel sounds heard. Central nervous system: Alert and oriented. No focal neurological deficits. Extremities: Symmetric 5 x 5 power. Skin: No rashes, lesions or ulcers Psychiatry: Judgement and insight appear normal. Mood & affect appropriate.     Data Reviewed: I have personally reviewed following labs and imaging studies  CBC: Recent Labs  Lab 05/13/22 2100 05/13/22 2132 05/13/22 2244 05/14/22 0446  WBC 8.4  --   --  7.7  NEUTROABS 6.8  --   --   --   HGB 8.7* 8.5* 8.8* 8.7*  HCT 25.1* 25.0*  --  25.6*  MCV 96.9  --   --  98.8  PLT 206  --   --  378   Basic Metabolic Panel: Recent Labs  Lab 05/13/22 2100 05/13/22 2132 05/13/22 2244 05/14/22 0446  NA 121* 121* 122* 127*  K 3.1* 3.4* 3.5 4.3  CL 91* 91* 91* 99  CO2 15*  --  19* 16*  GLUCOSE 96 91 110* 84  BUN 28* 25* 26* 24*  CREATININE 1.12* 1.10* 1.07* 0.88  CALCIUM 8.2*  --  8.6* 8.8*   GFR: CrCl cannot be calculated  (Unknown ideal weight.). Liver Function Tests: Recent Labs  Lab 05/13/22 2100 05/14/22 0446  AST 20 20  ALT 14 14  ALKPHOS 72 79  BILITOT 0.5 0.5  PROT 5.1* 5.3*  ALBUMIN 2.9* 2.9*   No results for input(s): "LIPASE", "AMYLASE" in the last 168 hours. No results for input(s): "AMMONIA" in the last 168 hours. Coagulation Profile: No results for input(s): "INR", "PROTIME" in the last 168 hours. Cardiac Enzymes: No results for input(s): "CKTOTAL", "CKMB", "CKMBINDEX", "TROPONINI" in the last 168 hours. BNP (last 3 results) No results for input(s): "PROBNP" in the last 8760 hours. HbA1C: Recent Labs    05/13/22 2244  HGBA1C 6.0*   CBG: Recent Labs  Lab 05/14/22 0756  GLUCAP 89   Lipid Profile: No results for input(s): "CHOL", "HDL", "LDLCALC", "TRIG", "CHOLHDL", "LDLDIRECT" in the last 72 hours. Thyroid Function Tests: Recent Labs    05/13/22 2244  TSH 2.163   Anemia Panel: Recent Labs    05/13/22 2244  TIBC 244*  IRON 30   Urine analysis:    Component Value Date/Time   COLORURINE YELLOW 02/01/2010 1157   APPEARANCEUR HAZY (A) 02/01/2010 1157   LABSPEC 1.018 02/01/2010 1157   PHURINE 5.0 02/01/2010 1157   GLUCOSEU NEGATIVE 02/01/2010 1157   HGBUR TRACE (A) 02/01/2010 1157   BILIRUBINUR NEGATIVE 02/01/2010 1157   KETONESUR 15 (A) 02/01/2010 1157   PROTEINUR NEGATIVE 02/01/2010 1157   UROBILINOGEN 0.2 02/01/2010 1157   NITRITE NEGATIVE 02/01/2010 1157   LEUKOCYTESUR NEGATIVE 02/01/2010 1157   Sepsis Labs: '@LABRCNTIP'$ (procalcitonin:4,lacticidven:4)  )No results found for this or any previous visit (from the past 240 hour(s)).       Radiology Studies: CT HEAD WO CONTRAST (5MM)  Result Date: 05/13/2022 CLINICAL DATA:  Head trauma, abnormal mental status (Age 28-64y) EXAM: CT HEAD WITHOUT CONTRAST TECHNIQUE: Contiguous axial images were obtained from the base of the skull through the vertex without intravenous contrast. RADIATION DOSE REDUCTION: This  exam was performed according to the departmental dose-optimization program which includes automated exposure control, adjustment of the mA and/or kV according to patient size and/or use of iterative reconstruction technique. COMPARISON:  02/01/2010 FINDINGS: Brain: No acute intracranial abnormality. Specifically, no hemorrhage, hydrocephalus, mass lesion, acute infarction, or significant intracranial injury. Vascular: No hyperdense vessel or unexpected calcification. Skull: No acute calvarial abnormality. Sinuses/Orbits: No acute findings Other: None IMPRESSION: Normal study for age. Electronically Signed   By: Rolm Baptise M.D.   On: 05/13/2022 23:48        Scheduled Meds:  melatonin  3 mg Oral QHS   pantoprazole (PROTONIX) IV  40 mg Intravenous Q12H   Continuous Infusions:  sodium chloride 75 mL/hr at 05/14/22 0959     LOS: 1 day     Ariel Maxim, MD [facility]  If 7PM-7AM, please contact night-coverage [email or web address] Password [email or web address] 05/14/2022, 10:07 AM

## 2022-05-15 ENCOUNTER — Other Ambulatory Visit: Payer: Self-pay

## 2022-05-15 ENCOUNTER — Encounter (HOSPITAL_COMMUNITY)
Admission: EM | Disposition: A | Discharge status: Home / Self Care | Payer: Self-pay | Source: Home / Self Care | Attending: Emergency Medicine

## 2022-05-15 ENCOUNTER — Observation Stay (HOSPITAL_COMMUNITY): Payer: 59 | Admitting: Anesthesiology

## 2022-05-15 ENCOUNTER — Telehealth: Payer: Self-pay

## 2022-05-15 ENCOUNTER — Encounter (HOSPITAL_COMMUNITY): Payer: Self-pay | Admitting: Internal Medicine

## 2022-05-15 ENCOUNTER — Observation Stay (HOSPITAL_BASED_OUTPATIENT_CLINIC_OR_DEPARTMENT_OTHER): Payer: 59 | Admitting: Anesthesiology

## 2022-05-15 DIAGNOSIS — K922 Gastrointestinal hemorrhage, unspecified: Secondary | ICD-10-CM | POA: Diagnosis not present

## 2022-05-15 DIAGNOSIS — D123 Benign neoplasm of transverse colon: Secondary | ICD-10-CM | POA: Diagnosis not present

## 2022-05-15 DIAGNOSIS — R69 Illness, unspecified: Secondary | ICD-10-CM | POA: Diagnosis not present

## 2022-05-15 DIAGNOSIS — F17210 Nicotine dependence, cigarettes, uncomplicated: Secondary | ICD-10-CM

## 2022-05-15 DIAGNOSIS — K635 Polyp of colon: Secondary | ICD-10-CM

## 2022-05-15 DIAGNOSIS — D62 Acute posthemorrhagic anemia: Secondary | ICD-10-CM | POA: Diagnosis not present

## 2022-05-15 DIAGNOSIS — K5731 Diverticulosis of large intestine without perforation or abscess with bleeding: Secondary | ICD-10-CM | POA: Diagnosis not present

## 2022-05-15 DIAGNOSIS — I10 Essential (primary) hypertension: Secondary | ICD-10-CM | POA: Diagnosis not present

## 2022-05-15 DIAGNOSIS — F1010 Alcohol abuse, uncomplicated: Secondary | ICD-10-CM

## 2022-05-15 DIAGNOSIS — D125 Benign neoplasm of sigmoid colon: Secondary | ICD-10-CM

## 2022-05-15 DIAGNOSIS — Z720 Tobacco use: Secondary | ICD-10-CM

## 2022-05-15 DIAGNOSIS — K5730 Diverticulosis of large intestine without perforation or abscess without bleeding: Secondary | ICD-10-CM

## 2022-05-15 HISTORY — PX: COLONOSCOPY WITH PROPOFOL: SHX5780

## 2022-05-15 HISTORY — PX: POLYPECTOMY: SHX5525

## 2022-05-15 LAB — BASIC METABOLIC PANEL
Anion gap: 10 (ref 5–15)
BUN: 11 mg/dL (ref 8–23)
CO2: 19 mmol/L — ABNORMAL LOW (ref 22–32)
Calcium: 8.5 mg/dL — ABNORMAL LOW (ref 8.9–10.3)
Chloride: 107 mmol/L (ref 98–111)
Creatinine, Ser: 0.82 mg/dL (ref 0.44–1.00)
GFR, Estimated: >60 mL/min (ref >60)
Glucose, Bld: 80 mg/dL (ref 70–99)
Potassium: 3.4 mmol/L — ABNORMAL LOW (ref 3.5–5.1)
Sodium: 136 mmol/L (ref 135–145)

## 2022-05-15 LAB — MAGNESIUM: Magnesium: 1.5 mg/dL — ABNORMAL LOW (ref 1.7–2.4)

## 2022-05-15 LAB — CBC
HCT: 23.5 % — ABNORMAL LOW (ref 36.0–46.0)
Hemoglobin: 8.1 g/dL — ABNORMAL LOW (ref 12.0–15.0)
MCH: 33.2 pg (ref 26.0–34.0)
MCHC: 34.5 g/dL (ref 30.0–36.0)
MCV: 96.3 fL (ref 80.0–100.0)
Platelets: 243 10*3/uL (ref 150–400)
RBC: 2.44 MIL/uL — ABNORMAL LOW (ref 3.87–5.11)
RDW: 13.5 % (ref 11.5–15.5)
WBC: 6 10*3/uL (ref 4.0–10.5)
nRBC: 0 % (ref 0.0–0.2)

## 2022-05-15 LAB — HEMOGLOBIN AND HEMATOCRIT, BLOOD
HCT: 23.1 % — ABNORMAL LOW (ref 36.0–46.0)
Hemoglobin: 8 g/dL — ABNORMAL LOW (ref 12.0–15.0)

## 2022-05-15 LAB — CORTISOL: Cortisol, Plasma: 5.4 ug/dL

## 2022-05-15 SURGERY — COLONOSCOPY WITH PROPOFOL
Anesthesia: Monitor Anesthesia Care

## 2022-05-15 MED ORDER — LIDOCAINE 2% (20 MG/ML) 5 ML SYRINGE
INTRAMUSCULAR | Status: DC | PRN
  Administered 2022-05-15: 60 mg via INTRAVENOUS

## 2022-05-15 MED ORDER — PANTOPRAZOLE SODIUM 40 MG PO TBEC
40.0000 mg | DELAYED_RELEASE_TABLET | Freq: Every day | ORAL | Status: DC
  Administered 2022-05-15: 40 mg via ORAL
  Filled 2022-05-15: qty 1

## 2022-05-15 MED ORDER — FOLIC ACID 1 MG PO TABS: 1.0000 mg | ORAL_TABLET | Freq: Every day | ORAL | Status: AC

## 2022-05-15 MED ORDER — THIAMINE HCL 100 MG PO TABS: 100.0000 mg | ORAL_TABLET | Freq: Every day | ORAL | Status: AC

## 2022-05-15 MED ORDER — LORAZEPAM 2 MG/ML IJ SOLN: 0.0000 mg | Freq: Four times a day (QID) | INTRAMUSCULAR | Status: DC

## 2022-05-15 MED ORDER — LORAZEPAM 2 MG/ML IJ SOLN: 0.0000 mg | Freq: Two times a day (BID) | INTRAMUSCULAR | Status: DC

## 2022-05-15 MED ORDER — ADULT MULTIVITAMIN W/MINERALS CH: 1.0000 | ORAL_TABLET | Freq: Every day | ORAL | Status: AC

## 2022-05-15 MED ORDER — PROPOFOL 10 MG/ML IV BOLUS
INTRAVENOUS | Status: DC | PRN
  Administered 2022-05-15 (×2): 20 mg via INTRAVENOUS

## 2022-05-15 MED ORDER — ACETAMINOPHEN 325 MG PO TABS
650.0000 mg | ORAL_TABLET | Freq: Four times a day (QID) | ORAL | Status: AC | PRN
Start: 2022-05-15 — End: ?

## 2022-05-15 MED ORDER — THIAMINE HCL 100 MG/ML IJ SOLN: 100.0000 mg | Freq: Every day | INTRAMUSCULAR | Status: DC

## 2022-05-15 MED ORDER — ADULT MULTIVITAMIN W/MINERALS CH
1.0000 | ORAL_TABLET | Freq: Every day | ORAL | Status: DC
  Administered 2022-05-15: 1 via ORAL
  Filled 2022-05-15: qty 1

## 2022-05-15 MED ORDER — PROPOFOL 500 MG/50ML IV EMUL
INTRAVENOUS | Status: DC | PRN
  Administered 2022-05-15: 125 ug/kg/min via INTRAVENOUS

## 2022-05-15 MED ORDER — PROPOFOL 10 MG/ML IV BOLUS: INTRAVENOUS | Status: DC | PRN

## 2022-05-15 MED ORDER — FOLIC ACID 1 MG PO TABS
1.0000 mg | ORAL_TABLET | Freq: Every day | ORAL | Status: DC
Start: 2022-05-15 — End: 2022-05-16
  Administered 2022-05-15: 1 mg via ORAL
  Filled 2022-05-15: qty 1

## 2022-05-15 MED ORDER — THIAMINE HCL 100 MG PO TABS
100.0000 mg | ORAL_TABLET | Freq: Every day | ORAL | Status: DC
  Administered 2022-05-15: 100 mg via ORAL
  Filled 2022-05-15: qty 1

## 2022-05-15 MED ORDER — LORAZEPAM 2 MG/ML IJ SOLN: 1.0000 mg | INTRAMUSCULAR | Status: DC | PRN

## 2022-05-15 MED ORDER — POTASSIUM CHLORIDE CRYS ER 10 MEQ PO TBCR
40.0000 meq | EXTENDED_RELEASE_TABLET | Freq: Once | ORAL | Status: AC
  Administered 2022-05-15: 40 meq via ORAL
  Filled 2022-05-15: qty 4

## 2022-05-15 MED ORDER — LORAZEPAM 1 MG PO TABS: 1.0000 mg | ORAL_TABLET | ORAL | Status: DC | PRN

## 2022-05-15 MED ORDER — LACTATED RINGERS IV SOLN: INTRAVENOUS | Status: DC | PRN

## 2022-05-15 MED ORDER — SODIUM CHLORIDE 0.9 % IV SOLN: INTRAVENOUS | Status: DC

## 2022-05-15 MED ORDER — PANTOPRAZOLE SODIUM 40 MG PO TBEC: 40.0000 mg | DELAYED_RELEASE_TABLET | Freq: Every day | ORAL | 1 refills | Status: AC

## 2022-05-15 MED ORDER — DEXMEDETOMIDINE (PRECEDEX) IN NS 20 MCG/5ML (4 MCG/ML) IV SYRINGE
PREFILLED_SYRINGE | INTRAVENOUS | Status: DC | PRN
  Administered 2022-05-15: 8 ug via INTRAVENOUS
  Administered 2022-05-15: 4 ug via INTRAVENOUS

## 2022-05-15 MED ORDER — MAGNESIUM SULFATE 4 GM/100ML IV SOLN
4.0000 g | Freq: Once | INTRAVENOUS | Status: AC
  Administered 2022-05-15: 4 g via INTRAVENOUS
  Filled 2022-05-15: qty 100

## 2022-05-15 SURGICAL SUPPLY — 22 items
ELECT REM PT RETURN 9FT ADLT (ELECTROSURGICAL)
ELECTRODE REM PT RTRN 9FT ADLT (ELECTROSURGICAL) IMPLANT
FCP BXJMBJMB 240X2.8X (CUTTING FORCEPS)
FLOOR PAD 36X40 (MISCELLANEOUS) ×3
FORCEPS BIOP RAD 4 LRG CAP 4 (CUTTING FORCEPS) IMPLANT
FORCEPS BIOP RJ4 240 W/NDL (CUTTING FORCEPS)
FORCEPS BXJMBJMB 240X2.8X (CUTTING FORCEPS) IMPLANT
INJECTOR/SNARE I SNARE (MISCELLANEOUS) IMPLANT
LUBRICANT JELLY 4.5OZ STERILE (MISCELLANEOUS) IMPLANT
MANIFOLD NEPTUNE II (INSTRUMENTS) IMPLANT
NDL SCLEROTHERAPY 25GX240 (NEEDLE) IMPLANT
NEEDLE SCLEROTHERAPY 25GX240 (NEEDLE) IMPLANT
PAD FLOOR 36X40 (MISCELLANEOUS) ×2 IMPLANT
PROBE APC STR FIRE (PROBE) IMPLANT
PROBE INJECTION GOLD (MISCELLANEOUS)
PROBE INJECTION GOLD 7FR (MISCELLANEOUS) IMPLANT
SNARE ROTATE MED OVAL 20MM (MISCELLANEOUS) IMPLANT
SYR 50ML LL SCALE MARK (SYRINGE) IMPLANT
TRAP SPECIMEN MUCOUS 40CC (MISCELLANEOUS) IMPLANT
TUBING ENDO SMARTCAP PENTAX (MISCELLANEOUS) IMPLANT
TUBING IRRIGATION ENDOGATOR (MISCELLANEOUS) ×4 IMPLANT
WATER STERILE IRR 1000ML POUR (IV SOLUTION) IMPLANT

## 2022-05-15 NOTE — Discharge Summary (Signed)
Physician Discharge Summary  Ariel Bridges WUJ:[id number] DOB: 12/01/1960 DOA: 05/13/2022  PCP: [facility], [place] date: 05/13/2022 Discharge date: 05/15/2022  Time spent: 55 minutes  Recommendations for Outpatient Follow-up:  Follow-up with Dr. Bryan Bridges as scheduled on 06/27/2022. Follow-up in GI office for CBC/lab work to be done in 7 to 10 days. Follow-up with Practice, [facility] in 2 weeks.  On follow-up patient will need a basic metabolic profile done to follow-up on electrolytes and renal function.  Patient will need a CBC done to follow-up on hemoglobin   Discharge Diagnoses:  Principal Problem:   Lower GI bleed Active Problems:   Diverticular hemorrhage   Tobacco abuse   Alcohol abuse   Essential hypertension   Diverticulosis of colon without hemorrhage   Polyp of sigmoid colon   Adenomatous polyp of transverse colon   Discharge Condition: Stable and improved  Diet recommendation: Full liquid diet and advance to soft diet in the next 24 hours  Filed Weights   05/15/22 0406 05/15/22 1032  Weight: 53 kg 53 kg    History of present illness:  HPI per Dr. Mikki Bridges is a 61 y.o. female with  medical history significant for HTN who presents to ED with 3 episodes of BRBPR starting at around 5 pm. Patient noted associated syncope with fall with LOC and where she hit her head prior to EMS arrival. She noted no prior symptoms like this in the past. She notes no history of anticoagulation use but does take 4 bc powders daily and drink 4-6 beers daily. She notes he had  pain with these symptoms but noted lower abdominal cramping and bloating  for the last 5-6 days associated with poor appetite and decrease intake.  She notes no sob ,chest pain , n/v/dysuria/ diarrhea prior to event.. She denies cough Ariel Bridges      ED Course:  in ED Hbg 8.7 was 12 (6/13)  Dr Ariel Bridges consulted notes will see in am however to be called if  bleeding increases, patient has had no further episode in ed, get CTA if patient rebleeds.  Hospital Course:  #1 lower GI bleed/diverticular bleed with episode of syncope -Patient admitted with abdominal pain, hematochezia, noted to have decreased appetite with some associated syncope. -Patient admitted serial CBCs obtained and GI consulted. -Patient noted with history of use of Ariel Bridges powders and daily alcohol use. -Patient kept n.p.o., hydrated with IV fluids and followed. -Patient seen in consultation by gastroenterology, Dr. Bryan Bridges who recommended colonoscopy for further evaluation. -Anemia panel obtained with a iron of 30, TIBC of 244, ferritin of 92, folate of 32.5, vitamin B12 level of 1225. -Patient had no further bleeding during the hospitalization and abdominal pain resolved by day of discharge -Patient underwent colonoscopy which showed 2 sessile polyps that were resected and sent for pathology, diverticulosis noted, single diverticulum with surrounding focal erythema in the sigmoid colon with presentation suspicious for uncomplicated diverticulitis. -As patient had improved clinically, had no further bleeding GI did not think antibiotic treatment was required at this time. -Hemoglobin stabilized at 8.0 and patient cleared for discharge by GI. -Patient was discharged on a full liquid diet and may advance at home as tolerated to a soft diet. -Outpatient follow-up with GI for lab work in 7 to 10 days and follow-up with Dr. Bryan Bridges in 8 weeks. -Patient was discharged in stable and improved condition.  2.  Alcohol abuse -Patient had no signs of alcohol withdrawal during  the hospitalization. -Patient was placed on Ativan withdrawal protocol, thiamine, folic acid, multivitamin. -Alcohol cessation stressed to patient. -Outpatient follow-up.  3.  Hyponatremia -Felt likely multifactorial secondary to prerenal azotemia secondary to dehydration in the setting of chronic alcohol  use. -Patient hydrated with IV fluids with resolution of hyponatremia but day of discharge.  4.  Hypertension -Patient's antihypertensive medications were held secondary to problem #1. -Antihypertensive medications will be resumed on discharge  Procedures: CT head without contrast 05/13/2022 Colonoscopy 05/15/2022 by Dr. Bryan Bridges   Consultations: Gastroenterology: Dr. Bryan Bridges 05/14/2022  Discharge Exam: Vitals:   05/15/22 1216 05/15/22 1230  BP: 137/89 (!) 150/89  Pulse: 80 73  Resp: 17 13  Temp:  97.8 F (36.6 C)  SpO2: 100% 100%    General: NAD Cardiovascular: Regular rate rhythm no murmurs rubs or gallops.  No JVD.  No lower extremity edema Respiratory: Clear to auscultation bilaterally.  No wheezes, no crackles, no rhonchi.  Discharge Instructions   Discharge Instructions     Diet full liquid   Complete by: As directed    Continue full liquids through today and if no further bleeding can advance to a soft diet tomorrow.   Increase activity slowly   Complete by: As directed    No wound care   Complete by: As directed       Allergies as of 05/15/2022       Reactions   Latex Itching, Other (See Comments)   redness        Medication List     STOP taking these medications    amoxicillin 500 MG capsule Commonly known as: AMOXIL   ibuprofen 200 MG tablet Commonly known as: ADVIL       TAKE these medications    acetaminophen 325 MG tablet Commonly known as: TYLENOL Take 2 tablets (650 mg total) by mouth every 6 (six) hours as needed for mild pain (or Fever >/= 101).   amLODipine 5 MG tablet Commonly known as: NORVASC Take 5 mg by mouth at bedtime.   atenolol 50 MG tablet Commonly known as: TENORMIN Take 50 mg by mouth at bedtime.   BC HEADACHE POWDER PO Take 1 packet by mouth in the morning, at noon, in the evening, and at bedtime.   cyclobenzaprine 10 MG tablet Commonly known as: FLEXERIL Take 10 mg by mouth at bedtime.   folic  acid 1 MG tablet Commonly known as: FOLVITE Take 1 tablet (1 mg total) by mouth daily. Start taking on: May 16, 2022   indomethacin 25 MG capsule Commonly known as: INDOCIN Take 25 mg by mouth 2 (two) times daily as needed (gout).   lisinopril-hydrochlorothiazide 20-12.5 MG tablet Commonly known as: ZESTORETIC Take 2 tablets by mouth at bedtime.   multivitamin with minerals Tabs tablet Take 1 tablet by mouth daily. Start taking on: May 16, 2022   pantoprazole 40 MG tablet Commonly known as: PROTONIX Take 1 tablet (40 mg total) by mouth daily at 6 (six) AM. Start taking on: May 16, 2022   thiamine 100 MG tablet Take 1 tablet (100 mg total) by mouth daily. Start taking on: May 16, 2022   TUMS PO Take 1 tablet by mouth See admin instructions. Take 1 tablet 2-3 times a day as needed   Tylenol PM Extra Strength 25-500 MG Tabs tablet Generic drug: diphenhydramine-acetaminophen Take 0.5 tablets by mouth at bedtime.       Allergies  Allergen Reactions   Latex Itching and Other (See Comments)  redness    Follow-up Information     [facility], [facility]. Schedule an appointment as soon as possible for a visit in 2 week(s).   Contact information: [place] [phone]         Lavena Bullion, DO Follow up on 06/27/2022.   Specialty: Gastroenterology Why: Follow-up as scheduled at 10:40 AM. Contact information: [address] [place] [address] [phone]                  The results of significant diagnostics from this hospitalization (including imaging, microbiology, ancillary and laboratory) are listed below for reference.    Significant Diagnostic Studies: CT HEAD WO CONTRAST (5MM)  Result Date: 05/13/2022 CLINICAL DATA:  Head trauma, abnormal mental status (Age 24-64y) EXAM: CT HEAD WITHOUT CONTRAST TECHNIQUE: Contiguous axial images were obtained from the base of the skull through the vertex  without intravenous contrast. RADIATION DOSE REDUCTION: This exam was performed according to the departmental dose-optimization program which includes automated exposure control, adjustment of the mA and/or kV according to patient size and/or use of iterative reconstruction technique. COMPARISON:  02/01/2010 FINDINGS: Brain: No acute intracranial abnormality. Specifically, no hemorrhage, hydrocephalus, mass lesion, acute infarction, or significant intracranial injury. Vascular: No hyperdense vessel or unexpected calcification. Skull: No acute calvarial abnormality. Sinuses/Orbits: No acute findings Other: None IMPRESSION: Normal study for age. Electronically Signed   By: Rolm Baptise M.D.   On: 05/13/2022 23:48    Microbiology: No results found for this or any previous visit (from the past 240 hour(s)).   Labs: Basic Metabolic Panel: Recent Labs  Lab 05/13/22 2100 05/13/22 2132 05/13/22 2244 05/14/22 0446 05/15/22 0331  NA 121* 121* 122* 127* 136  K 3.1* 3.4* 3.5 4.3 3.4*  CL 91* 91* 91* 99 107  CO2 15*  --  19* 16* 19*  GLUCOSE 96 91 110* 84 80  BUN 28* 25* 26* 24* 11  CREATININE 1.12* 1.10* 1.07* 0.88 0.82  CALCIUM 8.2*  --  8.6* 8.8* 8.5*  MG  --   --   --   --  1.5*   Liver Function Tests: Recent Labs  Lab 05/13/22 2100 05/14/22 0446  AST 20 20  ALT 14 14  ALKPHOS 72 79  BILITOT 0.5 0.5  PROT 5.1* 5.3*  ALBUMIN 2.9* 2.9*   No results for input(s): "LIPASE", "AMYLASE" in the last 168 hours. No results for input(s): "AMMONIA" in the last 168 hours. CBC: Recent Labs  Lab 05/13/22 2100 05/13/22 2132 05/13/22 2244 05/14/22 0446 05/14/22 1838 05/15/22 0331 05/15/22 1454  WBC 8.4  --   --  7.7  --  6.0  --   NEUTROABS 6.8  --   --   --   --   --   --   HGB 8.7* 8.5* 8.8* 8.7* 9.8* 8.1* 8.0*  HCT 25.1* 25.0*  --  25.6* 27.9* 23.5* 23.1*  MCV 96.9  --   --  98.8  --  96.3  --   PLT 206  --   --  204  --  243  --    Cardiac Enzymes: No results for input(s):  "CKTOTAL", "CKMB", "CKMBINDEX", "TROPONINI" in the last 168 hours. BNP: BNP (last 3 results) No results for input(s): "BNP" in the last 8760 hours.  ProBNP (last 3 results) No results for input(s): "PROBNP" in the last 8760 hours.  CBG: Recent Labs  Lab 05/14/22 0756  GLUCAP 89  Signed:  Irine Seal MD.  [facility] 05/15/2022, 6:26 PM

## 2022-05-15 NOTE — Transfer of Care (Signed)
Immediate Anesthesia Transfer of Care Note  Patient: Ariel Bridges  Procedure(s) Performed: COLONOSCOPY WITH PROPOFOL POLYPECTOMY  Patient Location: PACU  Anesthesia Type:MAC  Level of Consciousness: awake, alert , oriented, patient cooperative and responds to stimulation  Airway & Oxygen Therapy: Patient Spontanous Breathing and Patient connected to nasal cannula oxygen  Post-op Assessment: Report given to RN and Post -op Vital signs reviewed and stable  Post vital signs: Reviewed and stable  Last Vitals:  Vitals Value Taken Time  BP 120/82 05/15/22 1204  Temp    Pulse 89 05/15/22 1206  Resp 17 05/15/22 1206  SpO2 100 % 05/15/22 1206  Vitals shown include unvalidated device data.  Last Pain:  Vitals:   05/15/22 1032  TempSrc:   PainSc: 0-No pain         Complications: No notable events documented.

## 2022-05-15 NOTE — TOC Initial Note (Signed)
Transition of Care [facility]) - Initial/Assessment Note    Patient Details  Name: Ariel Bridges MRN: [id number] Date of Birth: 1960/11/16  Transition of [facility]) CM/SW Contact:    Ninfa Meeker, RN Phone Number: 05/15/2022, 3:47 PM  Clinical Narrative:    Transition of Care Screening Note:          Transition of Care Department [facility]) has reviewed patient and no TOC needs have been identified at this time. We will continue to monitor patient advancement through Interdisciplinary progressions. If new patient transition needs arise, please place a consult.                 Patient Goals and CMS Choice        Expected Discharge Plan and Services                                                Prior Living Arrangements/Services                       Activities of Daily Living Home Assistive Devices/Equipment: None ADL Screening (condition at time of admission) Patient's cognitive ability adequate to safely complete daily activities?: Yes Is the patient deaf or have difficulty hearing?: No Does the patient have difficulty seeing, even when wearing glasses/contacts?: No Does the patient have difficulty concentrating, remembering, or making decisions?: No Patient able to express need for assistance with ADLs?: Yes Does the patient have difficulty dressing or bathing?: No Independently performs ADLs?: Yes (appropriate for developmental age) Does the patient have difficulty walking or climbing stairs?: No Weakness of Legs: None Weakness of Arms/Hands: None  Permission Sought/Granted                  Emotional Assessment              Admission diagnosis:  Lower GI bleed [K92.2] Patient Active Problem List   Diagnosis Date Noted   Diverticulosis of colon without hemorrhage    Polyp of sigmoid colon    Adenomatous polyp of transverse colon    Tobacco abuse 05/14/2022   Alcohol abuse 05/14/2022   Essential hypertension 05/14/2022    Acute blood loss anemia    Lower GI bleed 05/13/2022   PCP:  Practice, [place]:   [facility] [address], [place] [address] [place] [address] Phone: [phone] Fax: [phone]     Social Determinants of Health (SDOH) Interventions    Readmission Risk Interventions     No data to display

## 2022-05-15 NOTE — Telephone Encounter (Signed)
-----   Message from Brady, DO sent at 05/15/2022 12:18 PM EDT ----- I expect this patient will be discharged in next 24 hours or so.  Can you please assist in the following:  - Check CBC 7-10 days after hospital discharge - Follow-up in the GI clinic in 6-8 weeks.  Can be with me or one of the APP's  Thanks

## 2022-05-15 NOTE — Telephone Encounter (Signed)
Left voicemail for pt to call back. CBC order placed and f/u with Dr. Bryan Lemma scheduled on 06/27/22 at 10:40 am.

## 2022-05-15 NOTE — Telephone Encounter (Signed)
Spoke with pt and let her know about follow up appt and lab that is due 7-10 days after discharge. Pt verbalized understanding and had no other concerns at end of call.

## 2022-05-15 NOTE — Op Note (Signed)
[facility] Patient Name: Ariel Bridges Procedure Date : 05/15/2022 MRN: [id number] Attending MD: Gerrit Heck , MD Date of Birth: 06-16-1961 CSN: [id number] Age: 61 Admit Type: Inpatient Procedure:                Colonoscopy Indications:              Lower abdominal pain, Hematochezia, Acute post                            hemorrhagic anemia                           61 yo admitted with lower abdominal pain x5 days                            and acute onset hematochezia followed by                            lightheadedness, syncope, and fall with LOC.                            Admission Hgb 8.7, but no comparison for review.                            Normal iron panel, B12, folate. No prior                            colonsocopy. No upper GI symptoms. Providers:                Gerrit Heck, MD, Dulcy Fanny, Cherylynn Ridges, Technician, Tressia Miners, CRNA Referring MD:              Medicines:                Monitored Anesthesia Care Complications:            No immediate complications. Estimated Blood Loss:     Estimated blood loss was minimal. Procedure:                Pre-Anesthesia Assessment:                           - Prior to the procedure, a History and Physical                            was performed, and patient medications and                            allergies were reviewed. The patient's tolerance of                            previous anesthesia was also reviewed. The risks                            and benefits of the procedure  and the sedation                            options and risks were discussed with the patient.                            All questions were answered, and informed consent                            was obtained. Prior Anticoagulants: The patient has                            taken no previous anticoagulant or antiplatelet                            agents. ASA Grade Assessment: III -  A patient with                            severe systemic disease. After reviewing the risks                            and benefits, the patient was deemed in                            satisfactory condition to undergo the procedure.                           After obtaining informed consent, the colonoscope                            was passed under direct vision. Throughout the                            procedure, the patient's blood pressure, pulse, and                            oxygen saturations were monitored continuously. The                            [id number]-HQ190TL ([id number] Olympus peds colonoscope was                            introduced through the anus and advanced to the the                            terminal ileum. The colonoscopy was performed                            without difficulty. The patient tolerated the                            procedure well. The quality of the bowel  preparation was good. The terminal ileum, ileocecal                            valve, appendiceal orifice, and rectum were                            photographed. Scope In: 11:35:33 AM Scope Out: 11:58:04 AM Scope Withdrawal Time: 0 hours 17 minutes 46 seconds  Total Procedure Duration: 0 hours 22 minutes 31 seconds  Findings:      The perianal and digital rectal examinations were normal.      A 6 mm polyp was found in the transverse colon. The polyp was sessile.       The polyp was removed with a cold snare. Resection and retrieval were       complete. Estimated blood loss was minimal.      A 4 mm polyp was found in the sigmoid colon. The polyp was sessile. The       polyp was removed with a cold snare. Resection and retrieval were       complete. Estimated blood loss was minimal.      Multiple small and large-mouthed diverticula were found in the entire       colon. There was a single diverticulum with surrounding focal erythema       in the sigmoid colon,  located 60 cm from the anal verge. The       diverticulum was lavaged. No active bleeding.      The retroflexed view of the distal rectum and anal verge was normal and       showed no anal or rectal abnormalities.      The terminal ileum appeared normal. Impression:               - One 6 mm polyp in the transverse colon, removed                            with a cold snare. Resected and retrieved.                           - One 4 mm polyp in the sigmoid colon, removed with                            a cold snare. Resected and retrieved.                           - Diverticulosis in the entire examined colon.                           - There was a single diverticulum with surrounding                            focal erythema in the sigmoid colon, located 60 cm                            from the anal verge. The appearance and clinical  presentation are suspicious for recent                            uncomplicated diverticulitis. Given overall                            clinical improvement and per most recently updated                            guidelines, will hold off on antimicrobial therapy.                           - The distal rectum and anal verge are normal on                            retroflexion view.                           - The examined portion of the ileum was normal. Recommendation:           - Return patient to hospital ward for ongoing care.                           - Full liquids in recovery, then advance diet as                            tolerated on the floor.                           - Continue present medications.                           - Await pathology results.                           - Repeat CBC in the morning. If stable and no                            further evidence of bleeding, expect discharge to                            home.                           - Repeat colonoscopy for surveillance based on                             pathology results.                           - Return to GI office in 8 weeks, or sooner as                            needed.                           -  Repeat CBC 7-10 days after hospital discharge. Procedure Code(s):        --- Professional ---                           [phone], Colonoscopy, flexible; with removal of                            tumor(s), polyp(s), or other lesion(s) by snare                            technique Diagnosis Code(s):        --- Professional ---                           K63.5, Polyp of colon                           R10.30, Lower abdominal pain, unspecified                           K92.1, Melena (includes Hematochezia)                           D62, Acute posthemorrhagic anemia                           K57.30, Diverticulosis of large intestine without                            perforation or abscess without bleeding CPT copyright 2019 [employer]. All rights reserved. The codes documented in this report are preliminary and upon coder review may  be revised to meet current compliance requirements. Gerrit Heck, MD 05/15/2022 12:16:55 PM Number of Addenda: 0

## 2022-05-15 NOTE — Plan of Care (Signed)
DISCHARGE NOTE HOME Ariel Bridges to be discharged home per MD order. Discussed prescriptions and follow up appointments with the patient. Medication list explained in detail. Patient verbalized understanding.  Skin clean, dry and intact without evidence of skin break down, no evidence of skin tears noted. IV catheter discontinued intact. Site without signs and symptoms of complications. Dressing and pressure applied. Pt denies pain at the site currently. No complaints noted.  Patient free of lines, drains, and wounds.   An After Visit Summary (AVS) was printed and given to the patient. Patient to be escorted via wheelchair, and discharged home via private auto.  Stephan Minister, RN

## 2022-05-15 NOTE — Anesthesia Postprocedure Evaluation (Signed)
Anesthesia Post Note  Patient: Ariel Bridges  Procedure(s) Performed: COLONOSCOPY WITH PROPOFOL POLYPECTOMY     Patient location during evaluation: PACU Anesthesia Type: MAC Level of consciousness: awake and alert Pain management: pain level controlled Vital Signs Assessment: post-procedure vital signs reviewed and stable Respiratory status: spontaneous breathing, nonlabored ventilation, respiratory function stable and patient connected to nasal cannula oxygen Cardiovascular status: stable and blood pressure returned to baseline Postop Assessment: no apparent nausea or vomiting Anesthetic complications: no   No notable events documented.  Last Vitals:  Vitals:   05/15/22 1216 05/15/22 1230  BP: 137/89 (!) 150/89  Pulse: 80 73  Resp: 17 13  Temp:  36.6 C  SpO2: 100% 100%    Last Pain:  Vitals:   05/15/22 1204  TempSrc:   PainSc: 0-No pain                 Ursala Cressy

## 2022-05-15 NOTE — Interval H&P Note (Signed)
History and Physical Interval Note:  No acute events overnight.  Tolerated bowel prep without issue.  H/H 8.1/23.5 Normal B12/folate, iron panel Hyponatremia resolved   05/15/2022 11:01 AM  Ariel Bridges  has presented today for surgery, with the diagnosis of 1 week abdominal pain, constipation.  Hematochezia starting 7/9.Marland Kitchen  The various methods of treatment have been discussed with the patient and family. After consideration of risks, benefits and other options for treatment, the patient has consented to  Procedure(s): COLONOSCOPY WITH PROPOFOL (N/A) as a surgical intervention.  The patient's history has been reviewed, patient examined, no change in status, stable for surgery.  I have reviewed the patient's chart and labs.  Questions were answered to the patient's satisfaction.     Dominic Pea Swanson Farnell

## 2022-05-16 LAB — SURGICAL PATHOLOGY

## 2022-05-16 LAB — HCV AB W REFLEX TO QUANT PCR: HCV Ab: NONREACTIVE

## 2022-05-16 LAB — HCV INTERPRETATION

## 2022-05-21 ENCOUNTER — Encounter (HOSPITAL_COMMUNITY): Payer: Self-pay

## 2022-05-21 ENCOUNTER — Inpatient Hospital Stay (HOSPITAL_COMMUNITY)
Admission: EM | Admit: 2022-05-21 | Discharge: 2022-05-28 | DRG: 378 | Disposition: A | Discharge status: Home / Self Care | Payer: 59 | Attending: Family Medicine | Admitting: Family Medicine

## 2022-05-21 DIAGNOSIS — K922 Gastrointestinal hemorrhage, unspecified: Principal | ICD-10-CM

## 2022-05-21 DIAGNOSIS — R0789 Other chest pain: Secondary | ICD-10-CM | POA: Diagnosis not present

## 2022-05-21 DIAGNOSIS — K219 Gastro-esophageal reflux disease without esophagitis: Secondary | ICD-10-CM | POA: Diagnosis present

## 2022-05-21 DIAGNOSIS — I70203 Unspecified atherosclerosis of native arteries of extremities, bilateral legs: Secondary | ICD-10-CM | POA: Diagnosis present

## 2022-05-21 DIAGNOSIS — E871 Hypo-osmolality and hyponatremia: Secondary | ICD-10-CM | POA: Diagnosis present

## 2022-05-21 DIAGNOSIS — I499 Cardiac arrhythmia, unspecified: Secondary | ICD-10-CM | POA: Diagnosis not present

## 2022-05-21 DIAGNOSIS — R911 Solitary pulmonary nodule: Secondary | ICD-10-CM | POA: Diagnosis present

## 2022-05-21 DIAGNOSIS — Z79899 Other long term (current) drug therapy: Secondary | ICD-10-CM

## 2022-05-21 DIAGNOSIS — E876 Hypokalemia: Secondary | ICD-10-CM | POA: Diagnosis present

## 2022-05-21 DIAGNOSIS — F1010 Alcohol abuse, uncomplicated: Secondary | ICD-10-CM | POA: Diagnosis present

## 2022-05-21 DIAGNOSIS — R079 Chest pain, unspecified: Secondary | ICD-10-CM | POA: Diagnosis not present

## 2022-05-21 DIAGNOSIS — I248 Other forms of acute ischemic heart disease: Secondary | ICD-10-CM | POA: Diagnosis present

## 2022-05-21 DIAGNOSIS — K5733 Diverticulitis of large intestine without perforation or abscess with bleeding: Secondary | ICD-10-CM | POA: Diagnosis not present

## 2022-05-21 DIAGNOSIS — D62 Acute posthemorrhagic anemia: Secondary | ICD-10-CM | POA: Diagnosis present

## 2022-05-21 DIAGNOSIS — Z86010 Personal history of colonic polyps: Secondary | ICD-10-CM

## 2022-05-21 DIAGNOSIS — I160 Hypertensive urgency: Secondary | ICD-10-CM | POA: Diagnosis present

## 2022-05-21 DIAGNOSIS — M5430 Sciatica, unspecified side: Secondary | ICD-10-CM | POA: Diagnosis present

## 2022-05-21 DIAGNOSIS — F17210 Nicotine dependence, cigarettes, uncomplicated: Secondary | ICD-10-CM | POA: Diagnosis present

## 2022-05-21 DIAGNOSIS — I7409 Other arterial embolism and thrombosis of abdominal aorta: Secondary | ICD-10-CM | POA: Diagnosis present

## 2022-05-21 DIAGNOSIS — E7439 Other disorders of intestinal carbohydrate absorption: Secondary | ICD-10-CM | POA: Diagnosis present

## 2022-05-21 DIAGNOSIS — I10 Essential (primary) hypertension: Secondary | ICD-10-CM | POA: Diagnosis not present

## 2022-05-21 DIAGNOSIS — E860 Dehydration: Secondary | ICD-10-CM | POA: Diagnosis present

## 2022-05-21 DIAGNOSIS — Z720 Tobacco use: Secondary | ICD-10-CM | POA: Diagnosis present

## 2022-05-21 DIAGNOSIS — Z91040 Latex allergy status: Secondary | ICD-10-CM

## 2022-05-21 DIAGNOSIS — I701 Atherosclerosis of renal artery: Secondary | ICD-10-CM | POA: Diagnosis present

## 2022-05-21 DIAGNOSIS — R9431 Abnormal electrocardiogram [ECG] [EKG]: Secondary | ICD-10-CM | POA: Diagnosis not present

## 2022-05-21 DIAGNOSIS — K551 Chronic vascular disorders of intestine: Secondary | ICD-10-CM | POA: Diagnosis present

## 2022-05-21 DIAGNOSIS — R58 Hemorrhage, not elsewhere classified: Secondary | ICD-10-CM | POA: Diagnosis not present

## 2022-05-21 HISTORY — DX: Alcohol abuse, uncomplicated: F10.10

## 2022-05-21 HISTORY — DX: Gastrointestinal hemorrhage, unspecified: K92.2

## 2022-05-21 HISTORY — DX: Nicotine dependence, unspecified, uncomplicated: F17.200

## 2022-05-21 HISTORY — DX: Diverticulosis of intestine, part unspecified, without perforation or abscess without bleeding: K57.90

## 2022-05-21 HISTORY — DX: Essential (primary) hypertension: I10

## 2022-05-21 HISTORY — DX: Polyp of colon: K63.5

## 2022-05-21 LAB — CBC WITH DIFFERENTIAL/PLATELET
Abs Immature Granulocytes: 0.12 10*3/uL — ABNORMAL HIGH (ref 0.00–0.07)
Basophils Absolute: 0.1 10*3/uL (ref 0.0–0.1)
Basophils Relative: 1 %
Eosinophils Absolute: 0.2 10*3/uL (ref 0.0–0.5)
Eosinophils Relative: 3 %
HCT: 15.6 % — ABNORMAL LOW (ref 36.0–46.0)
Hemoglobin: 5.5 g/dL — CL (ref 12.0–15.0)
Immature Granulocytes: 2 %
Lymphocytes Relative: 14 %
Lymphs Abs: 1.1 10*3/uL (ref 0.7–4.0)
MCH: 34 pg (ref 26.0–34.0)
MCHC: 35.3 g/dL (ref 30.0–36.0)
MCV: 96.3 fL (ref 80.0–100.0)
Monocytes Absolute: 0.6 10*3/uL (ref 0.1–1.0)
Monocytes Relative: 7 %
Neutro Abs: 5.9 10*3/uL (ref 1.7–7.7)
Neutrophils Relative %: 73 %
Platelets: 301 10*3/uL (ref 150–400)
RBC: 1.62 MIL/uL — ABNORMAL LOW (ref 3.87–5.11)
RDW: 13.2 % (ref 11.5–15.5)
WBC: 7.9 10*3/uL (ref 4.0–10.5)
nRBC: 0 % (ref 0.0–0.2)

## 2022-05-21 LAB — POC OCCULT BLOOD, ED: Fecal Occult Bld: POSITIVE — AB

## 2022-05-21 NOTE — Telephone Encounter (Signed)
Clear liquid diet, push fluids, rest at home If symptoms persist or worsen then  evaluation with her PCP or local ED.

## 2022-05-21 NOTE — ED Provider Notes (Signed)
[facility] DEPARTMENT Provider Note   CSN: [id number] Arrival date & time: 05/21/22  2229     History {Add pertinent medical, surgical, social history, OB history to HPI:1} Chief Complaint  Patient presents with   GI Bleeding    Ariel Bridges is a 61 y.o. female.  The history is provided by the patient.  She has history of hypertension, diverticulosis, colon polyps and comes in because of rectal bleeding since yesterday.  She states that she has had several episodes bleeding but she not sure if it was bright red or dark red.  She did not look to see if there were any clots.  She states it feels like diarrhea.  She denies any abdominal pain, nausea, vomiting.  She is complaining of feeling generally weak.  She is not on any anticoagulants, does not take aspirin although she occasionally takes [facility] (denies having taken it for the last week).  Of note, she had been admitted for lower GI bleed last week and did have a colonoscopy at that time with removal of 2 polyps.   Home Medications Prior to Admission medications   Medication Sig Start Date End Date Taking? Authorizing Provider  acetaminophen (TYLENOL) 325 MG tablet Take 2 tablets (650 mg total) by mouth every 6 (six) hours as needed for mild pain (or Fever >/= 101). 05/15/22   Eugenie Filler, MD  amLODipine (NORVASC) 5 MG tablet Take 5 mg by mouth at bedtime. 04/19/22   [provider]  Aspirin-Salicylamide-Caffeine (BC HEADACHE POWDER PO) Take 1 packet by mouth in the morning, at noon, in the evening, and at bedtime.    [provider]  atenolol (TENORMIN) 50 MG tablet Take 50 mg by mouth at bedtime. 05/02/22   [provider]  Calcium Carbonate Antacid (TUMS PO) Take 1 tablet by mouth See admin instructions. Take 1 tablet 2-3 times a day as needed    [provider]  cyclobenzaprine (FLEXERIL) 10 MG tablet Take 10 mg by mouth at bedtime. 04/19/22   [provider]  diphenhydramine-acetaminophen (TYLENOL PM EXTRA STRENGTH) 25-500 MG TABS tablet Take 0.5 tablets by mouth at bedtime.    [provider]  folic acid (FOLVITE) 1 MG tablet Take 1 tablet (1 mg total) by mouth daily. 05/16/22   Eugenie Filler, MD  indomethacin (INDOCIN) 25 MG capsule Take 25 mg by mouth 2 (two) times daily as needed (gout). 04/09/22   [provider]  lisinopril-hydrochlorothiazide (ZESTORETIC) 20-12.5 MG tablet Take 2 tablets by mouth at bedtime. 05/02/22   [provider]  Multiple Vitamin (MULTIVITAMIN WITH MINERALS) TABS tablet Take 1 tablet by mouth daily. 05/16/22   Eugenie Filler, MD  pantoprazole (PROTONIX) 40 MG tablet Take 1 tablet (40 mg total) by mouth daily at 6 (six) AM. 05/16/22   Eugenie Filler, MD  thiamine 100 MG tablet Take 1 tablet (100 mg total) by mouth daily. 05/16/22   Eugenie Filler, MD      Allergies    Latex    Review of Systems   Review of Systems  All other systems reviewed and are negative.   Physical Exam Updated Vital Signs BP 126/83 (BP Location: Left Arm)   Pulse 78   Temp 98.7 F (37.1 C) (Oral)   Resp 14   Ht '5\' 5"'$  (1.651 m)   Wt 53 kg   SpO2 98%   BMI 19.44 kg/m  Physical Exam Vitals and nursing note reviewed.  61 year old female, resting comfortably and in no acute distress. Vital signs are normal. Oxygen saturation is 98%, which is normal. Head is normocephalic and atraumatic. PERRLA, EOMI. Oropharynx is clear. Neck is nontender and supple without adenopathy or JVD. Back is nontender and there is no CVA tenderness. Lungs are clear without rales, wheezes, or rhonchi. Chest is nontender. Heart has regular rate and rhythm without murmur. Abdomen is soft, flat, nontender. Rectal: Normal sphincter tone, moderate amount of maroon stool present. Extremities have no cyanosis or edema, full range of motion is present. Skin is warm and dry without rash. Neurologic: Mental status is normal,  cranial nerves are intact, moves all extremities equally.  ED Results / Procedures / Treatments   Labs (all labs ordered are listed, but only abnormal results are displayed) Labs Reviewed - No data to display  EKG EKG Interpretation  Date/Time:  Monday May 21 2022 23:05:06 EDT Ventricular Rate:  77 PR Interval:  180 QRS Duration: 95 QT Interval:  419 QTC Calculation: 475 R Axis:   57 Text Interpretation: Sinus rhythm Probable anteroseptal infarct, old When compared with ECG of 05/13/2022, No significant change was found Confirmed by Delora Fuel ([id number]) on 05/21/2022 11:24:22 PM  Radiology No results found.  Procedures Procedures  Cardiac monitor shows normal sinus rhythm, per my interpretation.  Medications Ordered in ED Medications - No data to display  ED Course/ Medical Decision Making/ A&P                           Medical Decision Making  Lower GI bleeding.  Old records are reviewed showing hospitalization on 05/13/2022 for lower GI bleeding.  Colonoscopy on 05/15/2022 showed 2 polyps which were removed with cold snare's, presence of diverticulosis as well as stigmata suggestive of recent episode of diverticulitis.  Bleeding today could be from the site of polyp removal, but more likely from diverticulosis.  Less likely is AV malformation, peptic ulcer disease.  Screening labs have been ordered including CBC, basic metabolic panel, type and screen.  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  {Document critical care time when appropriate:1} {Document review of labs and clinical decision tools ie heart score, Chads2Vasc2 etc:1}  {Document your independent review of radiology images, and any outside records:1} {Document your discussion with family members, caretakers, and with consultants:1} {Document social determinants of health affecting pt's care:1} {Document your  decision making why or why not admission, treatments were needed:1} Final Clinical Impression(s) / ED Diagnoses Final diagnoses:  None    Rx / DC Orders ED Discharge Orders     None

## 2022-05-21 NOTE — Telephone Encounter (Signed)
Patient called states she has been experiencing rectal bleeding since yesterday seeking advise.

## 2022-05-21 NOTE — Telephone Encounter (Signed)
Pt recently discharged from hospital last week for lower gi bleed. Pt states the bleeding started again yesterday morning. Pt states she has had diarrhea with bleeding a couple times last night and twice this morning. Pt has some urgency but states she is able to make it to the bathroom in time. Pt denies fever but states she has slight intermittent abd pain (4/10) in her lower abd. Pt states she has dizziness and lightheadedness when she stands up but has not fainted. Pt also states when she states up she feels like her heart is beating fast and she feels like she is hyperventilating some but only for a short time. Advised pt that she should go to ED but pt wanted to verify with doctor to see if anything could be done outpatient to prevent her from going back to the hospital. Dr. Bryan Lemma is out of the office today. Dr. Fuller Plan as DOD please advise.

## 2022-05-21 NOTE — ED Triage Notes (Signed)
Pt from home via [facility] for rectal bleed and weakness. Pt has colonoscopy 3 days ago w/polyps. + orthostatic with EMS. VSS, Pt A&O x4.

## 2022-05-21 NOTE — Telephone Encounter (Signed)
Left message for pt to call back  °

## 2022-05-21 NOTE — Telephone Encounter (Signed)
Spoke with pt and gave pt recommendations. Pt verbalized understanding and had no other concerns at end of call.

## 2022-05-22 ENCOUNTER — Encounter (HOSPITAL_COMMUNITY): Payer: Self-pay | Admitting: Internal Medicine

## 2022-05-22 DIAGNOSIS — R911 Solitary pulmonary nodule: Secondary | ICD-10-CM | POA: Diagnosis present

## 2022-05-22 DIAGNOSIS — E7439 Other disorders of intestinal carbohydrate absorption: Secondary | ICD-10-CM | POA: Diagnosis present

## 2022-05-22 DIAGNOSIS — F17200 Nicotine dependence, unspecified, uncomplicated: Secondary | ICD-10-CM | POA: Diagnosis not present

## 2022-05-22 DIAGNOSIS — K921 Melena: Secondary | ICD-10-CM | POA: Diagnosis not present

## 2022-05-22 DIAGNOSIS — R933 Abnormal findings on diagnostic imaging of other parts of digestive tract: Secondary | ICD-10-CM | POA: Diagnosis not present

## 2022-05-22 DIAGNOSIS — R079 Chest pain, unspecified: Secondary | ICD-10-CM | POA: Diagnosis not present

## 2022-05-22 DIAGNOSIS — I10 Essential (primary) hypertension: Secondary | ICD-10-CM | POA: Diagnosis not present

## 2022-05-22 DIAGNOSIS — I160 Hypertensive urgency: Secondary | ICD-10-CM | POA: Diagnosis not present

## 2022-05-22 DIAGNOSIS — N280 Ischemia and infarction of kidney: Secondary | ICD-10-CM | POA: Diagnosis not present

## 2022-05-22 DIAGNOSIS — Z720 Tobacco use: Secondary | ICD-10-CM | POA: Diagnosis not present

## 2022-05-22 DIAGNOSIS — D62 Acute posthemorrhagic anemia: Secondary | ICD-10-CM | POA: Diagnosis not present

## 2022-05-22 DIAGNOSIS — K5733 Diverticulitis of large intestine without perforation or abscess with bleeding: Secondary | ICD-10-CM | POA: Diagnosis not present

## 2022-05-22 DIAGNOSIS — Z8719 Personal history of other diseases of the digestive system: Secondary | ICD-10-CM | POA: Diagnosis not present

## 2022-05-22 DIAGNOSIS — E860 Dehydration: Secondary | ICD-10-CM | POA: Diagnosis present

## 2022-05-22 DIAGNOSIS — F1010 Alcohol abuse, uncomplicated: Secondary | ICD-10-CM | POA: Diagnosis present

## 2022-05-22 DIAGNOSIS — Z86010 Personal history of colonic polyps: Secondary | ICD-10-CM | POA: Diagnosis not present

## 2022-05-22 DIAGNOSIS — K922 Gastrointestinal hemorrhage, unspecified: Secondary | ICD-10-CM

## 2022-05-22 DIAGNOSIS — E871 Hypo-osmolality and hyponatremia: Secondary | ICD-10-CM | POA: Diagnosis not present

## 2022-05-22 DIAGNOSIS — R69 Illness, unspecified: Secondary | ICD-10-CM | POA: Diagnosis not present

## 2022-05-22 DIAGNOSIS — Z79899 Other long term (current) drug therapy: Secondary | ICD-10-CM | POA: Diagnosis not present

## 2022-05-22 DIAGNOSIS — K219 Gastro-esophageal reflux disease without esophagitis: Secondary | ICD-10-CM | POA: Diagnosis present

## 2022-05-22 DIAGNOSIS — I70203 Unspecified atherosclerosis of native arteries of extremities, bilateral legs: Secondary | ICD-10-CM | POA: Diagnosis present

## 2022-05-22 DIAGNOSIS — I739 Peripheral vascular disease, unspecified: Secondary | ICD-10-CM | POA: Diagnosis not present

## 2022-05-22 DIAGNOSIS — E876 Hypokalemia: Secondary | ICD-10-CM | POA: Diagnosis not present

## 2022-05-22 DIAGNOSIS — I7409 Other arterial embolism and thrombosis of abdominal aorta: Secondary | ICD-10-CM | POA: Diagnosis not present

## 2022-05-22 DIAGNOSIS — I701 Atherosclerosis of renal artery: Secondary | ICD-10-CM | POA: Diagnosis not present

## 2022-05-22 DIAGNOSIS — M5430 Sciatica, unspecified side: Secondary | ICD-10-CM | POA: Diagnosis present

## 2022-05-22 DIAGNOSIS — K551 Chronic vascular disorders of intestine: Secondary | ICD-10-CM | POA: Diagnosis not present

## 2022-05-22 DIAGNOSIS — F17210 Nicotine dependence, cigarettes, uncomplicated: Secondary | ICD-10-CM | POA: Diagnosis present

## 2022-05-22 DIAGNOSIS — I248 Other forms of acute ischemic heart disease: Secondary | ICD-10-CM | POA: Diagnosis not present

## 2022-05-22 DIAGNOSIS — Z91040 Latex allergy status: Secondary | ICD-10-CM | POA: Diagnosis not present

## 2022-05-22 DIAGNOSIS — K5730 Diverticulosis of large intestine without perforation or abscess without bleeding: Secondary | ICD-10-CM | POA: Diagnosis not present

## 2022-05-22 DIAGNOSIS — K5731 Diverticulosis of large intestine without perforation or abscess with bleeding: Secondary | ICD-10-CM | POA: Diagnosis not present

## 2022-05-22 DIAGNOSIS — I7102 Dissection of abdominal aorta: Secondary | ICD-10-CM | POA: Diagnosis not present

## 2022-05-22 LAB — CBC WITH DIFFERENTIAL/PLATELET
Abs Immature Granulocytes: 0.06 10*3/uL (ref 0.00–0.07)
Basophils Absolute: 0.1 10*3/uL (ref 0.0–0.1)
Basophils Relative: 1 %
Eosinophils Absolute: 0.1 10*3/uL (ref 0.0–0.5)
Eosinophils Relative: 1 %
HCT: 23.2 % — ABNORMAL LOW (ref 36.0–46.0)
Hemoglobin: 8.4 g/dL — ABNORMAL LOW (ref 12.0–15.0)
Immature Granulocytes: 1 %
Lymphocytes Relative: 13 %
Lymphs Abs: 0.9 10*3/uL (ref 0.7–4.0)
MCH: 31.3 pg (ref 26.0–34.0)
MCHC: 36.2 g/dL — ABNORMAL HIGH (ref 30.0–36.0)
MCV: 86.6 fL (ref 80.0–100.0)
Monocytes Absolute: 0.4 10*3/uL (ref 0.1–1.0)
Monocytes Relative: 7 %
Neutro Abs: 5.1 10*3/uL (ref 1.7–7.7)
Neutrophils Relative %: 77 %
Platelets: 289 10*3/uL (ref 150–400)
RBC: 2.68 MIL/uL — ABNORMAL LOW (ref 3.87–5.11)
RDW: 16.8 % — ABNORMAL HIGH (ref 11.5–15.5)
WBC: 6.5 10*3/uL (ref 4.0–10.5)
nRBC: 0 % (ref 0.0–0.2)

## 2022-05-22 LAB — SODIUM: Sodium: 132 mmol/L — ABNORMAL LOW (ref 135–145)

## 2022-05-22 LAB — URINALYSIS, COMPLETE (UACMP) WITH MICROSCOPIC
Bacteria, UA: NONE SEEN
Bilirubin Urine: NEGATIVE
Glucose, UA: NEGATIVE mg/dL
Hgb urine dipstick: NEGATIVE
Ketones, ur: NEGATIVE mg/dL
Leukocytes,Ua: NEGATIVE
Nitrite: NEGATIVE
Protein, ur: NEGATIVE mg/dL
Specific Gravity, Urine: 1.002 — ABNORMAL LOW (ref 1.005–1.030)
pH: 7 (ref 5.0–8.0)

## 2022-05-22 LAB — COMPREHENSIVE METABOLIC PANEL
ALT: 14 U/L (ref 0–44)
AST: 23 U/L (ref 15–41)
Albumin: 2.8 g/dL — ABNORMAL LOW (ref 3.5–5.0)
Alkaline Phosphatase: 63 U/L (ref 38–126)
Anion gap: 10 (ref 5–15)
BUN: 11 mg/dL (ref 8–23)
CO2: 22 mmol/L (ref 22–32)
Calcium: 8.6 mg/dL — ABNORMAL LOW (ref 8.9–10.3)
Chloride: 99 mmol/L (ref 98–111)
Creatinine, Ser: 0.82 mg/dL (ref 0.44–1.00)
GFR, Estimated: >60 mL/min (ref >60)
Glucose, Bld: 91 mg/dL (ref 70–99)
Potassium: 3.9 mmol/L (ref 3.5–5.1)
Sodium: 131 mmol/L — ABNORMAL LOW (ref 135–145)
Total Bilirubin: 0.6 mg/dL (ref 0.3–1.2)
Total Protein: 5 g/dL — ABNORMAL LOW (ref 6.5–8.1)

## 2022-05-22 LAB — MAGNESIUM: Magnesium: 1.6 mg/dL — ABNORMAL LOW (ref 1.7–2.4)

## 2022-05-22 LAB — PROTIME-INR
INR: 1.1 (ref 0.8–1.2)
Prothrombin Time: 14.1 seconds (ref 11.4–15.2)

## 2022-05-22 LAB — BASIC METABOLIC PANEL
Anion gap: 12 (ref 5–15)
BUN: 14 mg/dL (ref 8–23)
CO2: 20 mmol/L — ABNORMAL LOW (ref 22–32)
Calcium: 8.2 mg/dL — ABNORMAL LOW (ref 8.9–10.3)
Chloride: 87 mmol/L — ABNORMAL LOW (ref 98–111)
Creatinine, Ser: 0.83 mg/dL (ref 0.44–1.00)
GFR, Estimated: >60 mL/min (ref >60)
Glucose, Bld: 127 mg/dL — ABNORMAL HIGH (ref 70–99)
Potassium: 3.6 mmol/L (ref 3.5–5.1)
Sodium: 119 mmol/L — CL (ref 135–145)

## 2022-05-22 LAB — IRON AND TIBC
Iron: 33 ug/dL (ref 28–170)
Saturation Ratios: 23 % (ref 10.4–31.8)
TIBC: 146 ug/dL — ABNORMAL LOW (ref 250–450)
UIBC: 113 ug/dL

## 2022-05-22 LAB — CBC
HCT: 22.5 % — ABNORMAL LOW (ref 36.0–46.0)
Hemoglobin: 8.2 g/dL — ABNORMAL LOW (ref 12.0–15.0)
MCH: 31.2 pg (ref 26.0–34.0)
MCHC: 36.4 g/dL — ABNORMAL HIGH (ref 30.0–36.0)
MCV: 85.6 fL (ref 80.0–100.0)
Platelets: 290 10*3/uL (ref 150–400)
RBC: 2.63 MIL/uL — ABNORMAL LOW (ref 3.87–5.11)
RDW: 17.7 % — ABNORMAL HIGH (ref 11.5–15.5)
WBC: 8.1 10*3/uL (ref 4.0–10.5)
nRBC: 0 % (ref 0.0–0.2)

## 2022-05-22 LAB — PREPARE RBC (CROSSMATCH)

## 2022-05-22 LAB — HEMOGLOBIN AND HEMATOCRIT, BLOOD
HCT: 24 % — ABNORMAL LOW (ref 36.0–46.0)
Hemoglobin: 8.4 g/dL — ABNORMAL LOW (ref 12.0–15.0)

## 2022-05-22 LAB — SODIUM, URINE, RANDOM: Sodium, Ur: 41 mmol/L

## 2022-05-22 LAB — CREATININE, URINE, RANDOM: Creatinine, Urine: <10 mg/dL

## 2022-05-22 LAB — OSMOLALITY: Osmolality: 263 mOsm/kg — ABNORMAL LOW (ref 275–295)

## 2022-05-22 LAB — ETHANOL: Alcohol, Ethyl (B): <10 mg/dL (ref <10)

## 2022-05-22 LAB — FERRITIN: Ferritin: 37 ng/mL (ref 11–307)

## 2022-05-22 LAB — TSH: TSH: 3.774 u[IU]/mL (ref 0.350–4.500)

## 2022-05-22 LAB — OSMOLALITY, URINE: Osmolality, Ur: 120 mOsm/kg — ABNORMAL LOW (ref 300–900)

## 2022-05-22 MED ORDER — HYDRALAZINE HCL 20 MG/ML IJ SOLN
5.0000 mg | Freq: Four times a day (QID) | INTRAMUSCULAR | Status: DC | PRN
  Administered 2022-05-26: 5 mg via INTRAVENOUS
  Filled 2022-05-22: qty 1

## 2022-05-22 MED ORDER — PANTOPRAZOLE 80MG IVPB - SIMPLE MED
80.0000 mg | Freq: Once | INTRAVENOUS | Status: AC
  Administered 2022-05-22: 80 mg via INTRAVENOUS
  Filled 2022-05-22: qty 80

## 2022-05-22 MED ORDER — PANTOPRAZOLE INFUSION (NEW) - SIMPLE MED
8.0000 mg/h | INTRAVENOUS | Status: DC
  Administered 2022-05-22: 8 mg/h via INTRAVENOUS
  Filled 2022-05-22: qty 80

## 2022-05-22 MED ORDER — FOLIC ACID 1 MG PO TABS
1.0000 mg | ORAL_TABLET | Freq: Every day | ORAL | Status: DC
Start: 2022-05-23 — End: 2022-05-28
  Administered 2022-05-23 – 2022-05-28 (×6): 1 mg via ORAL
  Filled 2022-05-22 (×6): qty 1

## 2022-05-22 MED ORDER — NICOTINE 14 MG/24HR TD PT24: 14.0000 mg | MEDICATED_PATCH | Freq: Every day | TRANSDERMAL | Status: DC | PRN

## 2022-05-22 MED ORDER — LACTATED RINGERS IV BOLUS
1000.0000 mL | Freq: Once | INTRAVENOUS | Status: AC
  Administered 2022-05-22: 1000 mL via INTRAVENOUS

## 2022-05-22 MED ORDER — ADULT MULTIVITAMIN W/MINERALS CH
1.0000 | ORAL_TABLET | Freq: Every day | ORAL | Status: DC
  Administered 2022-05-23 – 2022-05-28 (×6): 1 via ORAL
  Filled 2022-05-22 (×6): qty 1

## 2022-05-22 MED ORDER — ACETAMINOPHEN 325 MG PO TABS
650.0000 mg | ORAL_TABLET | Freq: Four times a day (QID) | ORAL | Status: DC | PRN
  Administered 2022-05-24 – 2022-05-28 (×4): 650 mg via ORAL
  Filled 2022-05-22 (×4): qty 2

## 2022-05-22 MED ORDER — PANTOPRAZOLE SODIUM 40 MG IV SOLR: 40.0000 mg | Freq: Two times a day (BID) | INTRAVENOUS | Status: DC

## 2022-05-22 MED ORDER — LORAZEPAM 2 MG/ML IJ SOLN: 1.0000 mg | INTRAMUSCULAR | Status: AC | PRN

## 2022-05-22 MED ORDER — LORAZEPAM 1 MG PO TABS
1.0000 mg | ORAL_TABLET | ORAL | Status: AC | PRN
  Administered 2022-05-22: 1 mg via ORAL
  Filled 2022-05-22: qty 4

## 2022-05-22 MED ORDER — PANTOPRAZOLE SODIUM 40 MG IV SOLR: 40.0000 mg | INTRAVENOUS | Status: DC

## 2022-05-22 MED ORDER — LACTATED RINGERS IV SOLN
INTRAVENOUS | Status: DC
Start: 2022-05-22 — End: 2022-05-28

## 2022-05-22 MED ORDER — SODIUM CHLORIDE 0.9 % IV SOLN
10.0000 mL/h | Freq: Once | INTRAVENOUS | Status: AC
  Administered 2022-05-22: 10 mL/h via INTRAVENOUS

## 2022-05-22 MED ORDER — ACETAMINOPHEN 650 MG RE SUPP: 650.0000 mg | Freq: Four times a day (QID) | RECTAL | Status: DC | PRN

## 2022-05-22 MED ORDER — THIAMINE HCL 100 MG/ML IJ SOLN
100.0000 mg | INTRAMUSCULAR | Status: DC
  Administered 2022-05-22 – 2022-05-23 (×2): 100 mg via INTRAVENOUS
  Filled 2022-05-22 (×2): qty 2

## 2022-05-22 NOTE — ED Notes (Signed)
1st blood transfusion completed [id number]

## 2022-05-22 NOTE — ED Notes (Signed)
Pt continues to sleep, even RR and unlabored, NAD noted, call bell in reach, side rails up x2 for safety, care on going, will continue to monitor. No s/s of blood transfusions reaction

## 2022-05-22 NOTE — ED Notes (Signed)
1st blood transfusion started [id number]

## 2022-05-22 NOTE — H&P (Signed)
History and Physical    PLEASE NOTE THAT DRAGON DICTATION SOFTWARE WAS USED IN THE CONSTRUCTION OF THIS NOTE.   Ariel Bridges Ariel Bridges DOB: [birth year] DOA: 05/21/2022  PCP: Practice, [facility]  Patient coming from: home   I have personally briefly reviewed patient's old medical records in [place]  Chief Complaint: maroon-appearing stool  HPI: Ariel Bridges is a 61 y.o. female with medical history significant for recent hospitalization for acute lower gastrointestinal bleed, diverticulosis , essential hypertension, chronic alcohol abuse , who is admitted to [facility] on 05/21/2022 with suspected acute lower gastrointestinal bleed after presenting from home to [facility] ED complaining of maroon appearing stool.   The patient was recently hospitalized from 05/13/2022 - 05/15/22 with acute lower gastrointestinal bleed associated with acute blood loss anemia, during which time underwent colonoscopy with Dr. Bryan Lemma of [facility] gastroenterology on 05/15/22, with this evaluation notable for nonbleeding 6 mm polyp in the transverse colon as well as nonbleeding 4 mm polyp in the sigmoid colon both of which were resected and removed.  Colonoscopy also notable for diverticulosis throughout the entire colon, without evidence of active bleed, while noting a single diverticulum in the sigmoid colon with some surrounding focal erythema, but in the absence of overt active bleed.  No EGD at that time.   During his previous hospitalization, her presenting hemoglobin was 8.7 on 05/13/2022, reaching a maximum level of 9.8, with final hemoglobin on the day of discharge noted to be 8.0.   The patient conveys that following discharged home on 05/15/2022, that she had experienced additional maroon appearing stool until yesterday, when she experienced 2 such episodes, in the absence of any bright red blood nor any dark appearing stool, prompting her to present back to [facility] emergency  department for further evaluation and management thereof.  She denies any associated abdominal discomfort, nausea, vomiting, chest pain, shortness of breath, dizziness, presyncope, or syncope.  No interval trauma.  She reports that the above constellation of symptoms are very similar to that with which she had presented on the day of previous admission for acute lower GI bleed on 05/13/2022.  None a blood thinners as an outpatient, including no aspirin, although, historically, she has taken some Goody's powder.  Denies any recent NSAID use.  While she has a history of chronic alcohol abuse, most recent alcohol reportedly consumed yesterday, she denies any known history of underlying chronic liver pathology and no known history of varices or variceal bleed.   Per chart review, serum sodium level on day of most recent admission was 121, felt to be multifactorial in nature, with contributions from dehydration superimposed on beer drinkers [place] mania.  Continue IV fluids, and sitting serum sodium improved, with final sodium value on day of discharge noted to be 136.  Outpatient medications notable for HCTZ.     ED Course:  Vital signs in the ED were notable for the following: Afebrile; heart rate 75-92; blood pressure 110/74 - 132/94; respiratory rate 16-20, oxygen saturation 96 to 100% on room air.  Labs were notable for the following: BMP notable for the following: Sodium 119, chloride 87, BUN 14 compared to most recent prior value of 11 on 05/15/2022, creatinine 0.3 compared to 0.82 on 05/15/2022, glucose 127.  CBC notable for the following: Will blood cell count 7900, hemoglobin 5.5 associate with normocytic/normochromic findings as well as nonelevated RDW, platelet count 301.  DRE performed by EDP revealed maroon appearing stool, without bright red blood or  melena, which was subsequently found to be fecal occult blood positive.  Imaging and additional notable ED work-up: EKG showed sinus rhythm with heart  rate 77, normal intervals, no evidence of T wave or ST changes, including no evidence of ST elevation.  EDP contacted the on-call [place] gastroenterologist, Dr. Lorenso Courier, Who will formally consult and see the patient in the morning, without any interval recommendations for overnight bowel prep.  While in the ED, the following were administered: Started on transfusion of 2 units PRBC, and received a 1 L LR bolus.  Subsequently, the patient was admitted for further evaluation management of suspected acute/recurrent lower gastrointestinal bleed associated with acute blood loss anemia and acute hyponatremia.     Review of Systems: As per HPI otherwise 10 point review of systems negative.   Past Medical History:  Diagnosis Date   Colon polyps    Hypertension    Smoker     Past Surgical History:  Procedure Laterality Date   CESAREAN SECTION     COLONOSCOPY WITH PROPOFOL N/A 05/15/2022   Procedure: COLONOSCOPY WITH PROPOFOL;  Surgeon: Lavena Bullion, DO;  Location: [place];  Service: Gastroenterology;  Laterality: N/A;   POLYPECTOMY  05/15/2022   Procedure: POLYPECTOMY;  Surgeon: Lavena Bullion, DO;  Location: [facility];  Service: Gastroenterology;;    Social History:  reports that she has been smoking cigarettes. She has been exposed to tobacco smoke. She has never used smokeless tobacco. She reports current alcohol use. She reports that she does not use drugs.   Allergies  Allergen Reactions   Latex Itching and Other (See Comments)    redness    Family history reviewed and not pertinent   Prior to Admission medications   Medication Sig Start Date End Date Taking? Authorizing Provider  acetaminophen (TYLENOL) 325 MG tablet Take 2 tablets (650 mg total) by mouth every 6 (six) hours as needed for mild pain (or Fever >/= 101). 05/15/22  Yes Eugenie Filler, MD  amLODipine (NORVASC) 5 MG tablet Take 5 mg by mouth at bedtime. 04/19/22  Yes [provider]   atenolol (TENORMIN) 50 MG tablet Take 50 mg by mouth at bedtime. 05/02/22  Yes [provider]  Calcium Carbonate Antacid (TUMS PO) Take 1 tablet by mouth See admin instructions. Take 1 tablet 2-3 times a day as needed for acid reflux   Yes [provider]  cyclobenzaprine (FLEXERIL) 10 MG tablet Take 10 mg by mouth at bedtime. 04/19/22  Yes [provider]  diphenhydramine-acetaminophen (TYLENOL PM EXTRA STRENGTH) 25-500 MG TABS tablet Take 0.5 tablets by mouth at bedtime.   Yes [provider]  folic acid (FOLVITE) 1 MG tablet Take 1 tablet (1 mg total) by mouth daily. 05/16/22  Yes Eugenie Filler, MD  indomethacin (INDOCIN) 25 MG capsule Take 25 mg by mouth 2 (two) times daily as needed (gout). 04/09/22  Yes [provider]  lisinopril-hydrochlorothiazide (ZESTORETIC) 20-12.5 MG tablet Take 2 tablets by mouth at bedtime. 05/02/22  Yes [provider]  Multiple Vitamin (MULTIVITAMIN WITH MINERALS) TABS tablet Take 1 tablet by mouth daily. 05/16/22  Yes Eugenie Filler, MD  pantoprazole (PROTONIX) 40 MG tablet Take 1 tablet (40 mg total) by mouth daily at 6 (six) AM. 05/16/22  Yes Eugenie Filler, MD  thiamine 100 MG tablet Take 1 tablet (100 mg total) by mouth daily. 05/16/22  Yes Eugenie Filler, MD  Aspirin-Salicylamide-Caffeine [facility] HEADACHE POWDER PO) Take 1 packet by mouth in  the morning, at noon, in the evening, and at bedtime. Patient not taking: Reported on 05/22/2022    [provider]     Objective    Physical Exam: Vitals:   05/22/22 0326 05/22/22 0326 05/22/22 0329 05/22/22 0345  BP:  (!) 132/94 (!) 130/91 110/72  Pulse:  77 76 74  Resp:  '17 16 15  '$ Temp:  98 F (36.7 C) 98 F (36.7 C) 97.9 F (36.6 C)  TempSrc:  Oral Oral Oral  SpO2: 100% 100% 100% 100%  Weight:      Height:        General: appears to be stated age; alert, oriented Skin: warm, dry, no rash Head:  AT/[place] Mouth:  Oral mucosa membranes  appear dry, normal dentition Neck: supple; trachea midline Heart:  RRR; did not appreciate any M/R/G Lungs: CTAB, did not appreciate any wheezes, rales, or rhonchi Abdomen: + BS; soft, ND, NT Vascular: 2+ pedal pulses b/l; 2+ radial pulses b/l Extremities: no peripheral edema, no muscle wasting Neuro: strength and sensation intact in upper and lower extremities b/l   Labs on Admission: I have personally reviewed following labs and imaging studies  CBC: Recent Labs  Lab 05/15/22 1454 05/21/22 2314  WBC  --  7.9  NEUTROABS  --  5.9  HGB 8.0* 5.5*  HCT 23.1* 15.6*  MCV  --  96.3  PLT  --  371   Basic Metabolic Panel: Recent Labs  Lab 05/21/22 2314  NA 119*  K 3.6  CL 87*  CO2 20*  GLUCOSE 127*  BUN 14  CREATININE 0.83  CALCIUM 8.2*   GFR: Estimated Creatinine Clearance: 58.8 mL/min (by C-G formula based on SCr of 0.83 mg/dL). Liver Function Tests: No results for input(s): "AST", "ALT", "ALKPHOS", "BILITOT", "PROT", "ALBUMIN" in the last 168 hours. No results for input(s): "LIPASE", "AMYLASE" in the last 168 hours. No results for input(s): "AMMONIA" in the last 168 hours. Coagulation Profile: No results for input(s): "INR", "PROTIME" in the last 168 hours. Cardiac Enzymes: No results for input(s): "CKTOTAL", "CKMB", "CKMBINDEX", "TROPONINI" in the last 168 hours. BNP (last 3 results) No results for input(s): "PROBNP" in the last 8760 hours. HbA1C: No results for input(s): "HGBA1C" in the last 72 hours. CBG: No results for input(s): "GLUCAP" in the last 168 hours. Lipid Profile: No results for input(s): "CHOL", "HDL", "LDLCALC", "TRIG", "CHOLHDL", "LDLDIRECT" in the last 72 hours. Thyroid Function Tests: No results for input(s): "TSH", "T4TOTAL", "FREET4", "T3FREE", "THYROIDAB" in the last 72 hours. Anemia Panel: No results for input(s): "VITAMINB12", "FOLATE", "FERRITIN", "TIBC", "IRON", "RETICCTPCT" in the last 72 hours. Urine analysis:    Component Value  Date/Time   COLORURINE YELLOW 02/01/2010 1157   APPEARANCEUR HAZY (A) 02/01/2010 1157   LABSPEC 1.018 02/01/2010 1157   PHURINE 5.0 02/01/2010 1157   GLUCOSEU NEGATIVE 02/01/2010 1157   HGBUR TRACE (A) 02/01/2010 1157   BILIRUBINUR NEGATIVE 02/01/2010 1157   KETONESUR 15 (A) 02/01/2010 1157   PROTEINUR NEGATIVE 02/01/2010 1157   UROBILINOGEN 0.2 02/01/2010 1157   NITRITE NEGATIVE 02/01/2010 1157   LEUKOCYTESUR NEGATIVE 02/01/2010 1157    Radiological Exams on Admission: No results found.   EKG: Independently reviewed, with result as described above.    Assessment/Plan   Principal Problem:   Acute lower GI bleeding Active Problems:   Tobacco abuse   Alcohol abuse   Essential hypertension   Acute blood loss anemia   Acute hyponatremia       #) Acute lower GI  bleed: diagnosis on the basis of recurrence of maroon appearing stool after 6 to 7-day hiatus and this finding, confirmed by the DRE with fecal occult blood test positive in the ED today. A non-elevated presenting BUN further substantiates suspicion of a lower GI source. Also a/w evidence of acute blood loss anemia, with presenting Hgb noted to be 5.5 compared to hemoglobin level on day of discharge on 05/15/2022, which was noted to be 8.0.  This is in spite of multiple risk factors for acute upper gastrointestinal bleed including the patient's history of chronic alcohol abuse as well as intermittent use of Goody's powder as an outpatient.  Likelihood of acute upper gastrointestinal bleed also rendered less likely by the  patient's hemodynamic stability with normotensive blood pressures and nontachycardic heart rates in spite of hemoglobin of 5.5, also suggesting a slow bleed, regardless of the source.   Colonoscopy during most recent hospitalization notable for removal of 2 polyps as well as evidence of diverticulosis throughout the colon, without evidence of active bleed.  Given this history/findings on colonoscopy, suspect  that today's presenting acute lower gastrointestinal bleed is likely on the basis of additional diverticular bleed, with bleed from site of recent polypectomy appearing to be less likely.  Given suspected lower GI source, there does not appear to be an indication for initiation of Protonix drip, but will transiently convert her outpatient oral Protonix to daily dosing of IV Protonix. Of note, patient was typed and screened in the ED today, with orders placed for transfusion of 2 units PRBC.  EDP consulted on-call [place] gastroenterologist who will see the patient in the morning for formal consultation, without interval recommendation for overnight bowel prep.      Plan: NPO except for sips of clears. Refraining from pharmacologic DVT prophylaxis. Monitor on telemetry. Monitor continuous pulse-ox. Maintain at least 2 large bore IV's.  Complete transfusion of 2 units PRBC, as above.  Add on INR.  Q4H H&H's have been ordered through 1300 on 05/22/2022. Will closely monitor these ensuing Hgb levels and correlate these data points with the patient's overall clinical picture including vital signs to determine need for additional transfusion.  Gastroenterology consulted, as above.  Protonix 40 mg IV daily.        #) Acute blood loss anemia: Versus acute on chronic anemia, in the setting of suspected acute lower GI bleed, as above.  Outside of the most recent prior hospitalization, baseline hemoglobin is unclear given most recent prior hemoglobin data point occurred in 2011.  Consequently, treatment history of anemia is not entirely clear at this time, although the degree of chronicity is suspected given her degree of hemodynamic stability in spite of her presenting low hemoglobin levels.   Plan: work-up and management for presenting suspected acute lower GI bleed, as above, including transfusion of 2 units PBC followed by close monitoring of Q4H H&H's. Monitor on telemetry. Monitor continuous pulse-ox.  Refraining from pharmacologic DVT prophylaxis.  Add on INR. Add on the following to initial lab specimen collected in the ED today: total iron, TIBC, ferritin.  GI consulted, as above.         #) Acute hypo-osmolar  hyponatremia: Presenting serum sodium of 119 compared to most recent prior sodium value of 136 on 05/15/2022.  Of note, she had similar sodium level, 121, on day of most recent prior hospitalization, which corrected via NovoLog fluids, and was felt to be consistent with dehydration superimposed on her chronic alcohol abuse.  Suspect similar underlying contributing factors  leading to this evening's hyponatremic finding, along with potential pharmacologic impact of outpatient HCTZ. Of note, no evidence of associated acute focal neurologic deficits and no report of recent trauma.    Plan: monitor strict I's and O's and daily weights.  check UA, random urine sodium, urine osmolality.  Check serum osmolality to confirm suspected hypoosmolar etiology.  Repeat BMP in the morning and again at 0900 for close trending of serum sodium level and to ensure that there is no rapid correction. Check TSH.  Will initiate gentle IVF's in the form of LR 50 cc/h following completion of 2 units PRBC in the setting of her presenting acute lower GI bleed.  Hold outpatient HCTZ for now.  Patient counseled on the importance of reduction in alcohol consumption.  Add on serum ethanol level.  Seizure precautions ordered.           #) Essential Hypertension: documented h/o such, with outpatient antihypertensive regimen including Norvasc and atenolol in addition to lisinopril and HCTZ.  SBP's in the ED today: Normotensive mmHg. setting of acute hyponatremia, will hold HCTZ for now.  Additionally, in setting of acute liver failure distillate associated acute blood loss anemia, will hold the remainder of her antihypertensive medications for now.  Plan: Close monitoring of subsequent BP via routine VS. holding  home hypertensive medications for now, as above.         #) Chronic Alcohol Abuse: In the setting of history of chronic alcohol abuse, the patient continues to report daily alcohol consumption.  No evidence to suggest acute alcohol withdrawal at this time, will closely monitor for ensuing evidence for development of such, as further detailed below.  Of note, no evidence of alcohol withdrawal during most recent prior hospitalization.  Plan: counseled the patient on the importance of reduction in alcohol consumption. Close monitoring of ensuing BP and HR via routine VS. Symptoms-based CIWA protocol with prn Ativan ordered. Seizure precautions. Telemetry. Add-on serum Mg level.  Repeat CMP in the morning. Check INR. Add-on serum ethanol level. UDS.  Thiamine 100 mg IV daily, with first dose now.  Daily oral folic acid and multivitamin supplementation, with next dose is to occur on 05/23/2022.  Add on serum ethanol level.         #) Chronic tobacco abuse: Patient conveys that they are a current smoker.   Plan: Counseled the patient, for less than 2 minutes, on the importance of complete smoking discontinuation.  Order placed for prn nicotine patch for use during this hospitalization.        DVT prophylaxis: SCD's   Code Status: Full code Family Communication: none Disposition Plan: Per Rounding Team Consults called: EDP consulted on-call [facility] (Dr. Lorenso Courier), who will see patient in the AM;  Admission status: observation.     PLEASE NOTE THAT DRAGON DICTATION SOFTWARE WAS USED IN THE CONSTRUCTION OF THIS NOTE.   [place] DO [facility]  From [place]   05/22/2022, 3:58 AM

## 2022-05-22 NOTE — ED Notes (Signed)
Unable to get orthostatic vitals d/t pt's weakness

## 2022-05-22 NOTE — ED Notes (Signed)
Daughter has left for the night

## 2022-05-22 NOTE — ED Notes (Signed)
Secure message sent to Dr. Velia Meyer, advised pt reports needing to urinate however unable to, pt has a purwick, reports last void was at home, but unsure what time. Pt reports lower abd pain d/t having to urinate. Verbal order to get bladder scan

## 2022-05-22 NOTE — ED Notes (Signed)
Rate increased to 150 ml/hr for blood transfusion

## 2022-05-22 NOTE — Consult Note (Addendum)
[facility] Consult: 10:13 AM 05/22/2022  LOS: 0 days      Referring Provider: Dr Waldron Labs  Primary Care Physician:  [facility], [facility] Primary Gastroenterologist:  Dr. Bryan Lemma   Reason for Consultation: GI bleed, anemia.  Colon polypectomies a week ago.   HPI: Ariel Bridges is a 61 y.o. female.  PMH alcohol abuse.  Smoker.  3 C-sections 1 involving appendectomy. Admission a week ago when seen by GI for hematochezia with fleeting abdominal pain, Hgb 8.5, hyponatremia.  Reported drinking 6 pack of beer daily and taking 3-4 BC powders daily.  05/15/2022 colonoscopy: A 6 mm polyp was removed from transverse colon, 4 mm polyp removed from sigmoid (path for both: inflammatory polyps). Focal erythema surrounding a solitary sigmoid diverticulum, suspicious for recent diverticulitis.  Dr. Bryan Lemma felt that given clinical improvement and current guidelines, antimicrobial therapy was not necessary.  Discharged after 3-day admission with plans for follow-up CBC, be met in 7 to 10 days.  Labs did not confirm B12, folate or iron deficiency.  Glucose levels moderately elevated and A1c moderately elevated.  HCV, HIV negative.  She did not undergo GI imaging.  8/23 ROV w Dr C.   After discharge last week patient resumed having brown stools.  Appetite somewhat depressed but no nausea, vomiting.  Went to work over the weekend without a problem.  Starting Sunday developed recurrent hematochezia, fecal urgency, some.  Called GI office and advised to follow clear liquids, rest.  Proceed to ED or PCP if situation worsened, symptoms persisted.  Then experienced positional dizziness, tachycardia.  Came to ED.  She had 1 beer over the weekend.  Had not resumed taking any aspirin products since prior to recent admission. Blood pressures 120s to 130s/70s to 80s.  Heart rate 70s  to 80s.  Room air sats excellent.  No fever. Hgb 5.5.  Was 8 a week ago at discharge.   Na 119 (nadir 121 last week).  BUNs/creatinine (BUN to 28 last week), potassium normal.  T. bili, alk phos, transaminases normal.  Protein and albumin levels reduced. Urine sodium 41, normal.   S/p 2 PRBCs.  Repeat Hgb pending.  PPI drip initiated.   Generally consumes 6 pack of beer daily.  Works as a Educational psychologist.  1 pack/day smoker No family history of colorectal cancer, colitis, gastritis, ulcer disease, anemia.         Past Medical History:  Diagnosis Date   Alcohol abuse     Colon polyps     Diverticulosis     Hypertension     Lower GI bleed     Smoker             Past Surgical History:  Procedure Laterality Date   CESAREAN SECTION       COLONOSCOPY WITH PROPOFOL N/A 05/15/2022    Procedure: COLONOSCOPY WITH PROPOFOL;  Surgeon: Lavena Bullion, DO;  Location: [place];  Service: Gastroenterology;  Laterality: N/A;   POLYPECTOMY   05/15/2022    Procedure: POLYPECTOMY;  Surgeon: Lavena Bullion, DO;  Location: [facility];  Service: Gastroenterology;;             Prior to Admission medications   Medication Sig Start Date End Date Taking? Authorizing Provider  acetaminophen (TYLENOL) 325 MG tablet Take 2 tablets (650 mg total) by mouth every 6 (six) hours as needed for mild pain (or Fever >/= 101). 05/15/22   Yes Eugenie Filler, MD  amLODipine (NORVASC) 5 MG tablet Take 5 mg by mouth at bedtime. 04/19/22   Yes [provider]  atenolol (TENORMIN) 50 MG tablet Take 50 mg by mouth at bedtime. 05/02/22   Yes [provider]  Calcium Carbonate Antacid (TUMS PO) Take 1 tablet by mouth See admin instructions. Take 1 tablet 2-3 times a day as needed for acid reflux     Yes [provider]  cyclobenzaprine (FLEXERIL) 10 MG tablet Take 10 mg by mouth at bedtime. 04/19/22   Yes [provider]  diphenhydramine-acetaminophen (TYLENOL PM EXTRA STRENGTH)  25-500 MG TABS tablet Take 0.5 tablets by mouth at bedtime.     Yes [provider]  folic acid (FOLVITE) 1 MG tablet Take 1 tablet (1 mg total) by mouth daily. 05/16/22   Yes Eugenie Filler, MD  indomethacin (INDOCIN) 25 MG capsule Take 25 mg by mouth 2 (two) times daily as needed (gout). 04/09/22   Yes [provider]  lisinopril-hydrochlorothiazide (ZESTORETIC) 20-12.5 MG tablet Take 2 tablets by mouth at bedtime. 05/02/22   Yes [provider]  Multiple Vitamin (MULTIVITAMIN WITH MINERALS) TABS tablet Take 1 tablet by mouth daily. 05/16/22   Yes Eugenie Filler, MD  pantoprazole (PROTONIX) 40 MG tablet Take 1 tablet (40 mg total) by mouth daily at 6 (six) AM. 05/16/22   Yes Eugenie Filler, MD  thiamine 100 MG tablet Take 1 tablet (100 mg total) by mouth daily. 05/16/22   Yes Eugenie Filler, MD  Aspirin-Salicylamide-Caffeine [facility] HEADACHE POWDER PO) Take 1 packet by mouth in the morning, at noon, in the evening, and at bedtime. Patient not taking: Reported on 05/22/2022       [provider]      Scheduled Meds:  [START ON 0/93/2355] folic acid  1 mg Oral Daily   [START ON 05/23/2022] multivitamin with minerals  1 tablet Oral Daily   [START ON 05/25/2022] pantoprazole  40 mg Intravenous Q12H   thiamine injection  100 mg Intravenous Q24H    Infusions:  lactated ringers Stopped (05/22/22 0442)   pantoprazole 8 mg/hr (05/22/22 0650)    PRN Meds: acetaminophen **OR** acetaminophen, hydrALAZINE, LORazepam **OR** LORazepam, nicotine          Allergies as of 05/21/2022 - Review Complete 05/21/2022  Allergen Reaction Noted   Latex Itching and Other (See Comments) 05/14/2022      History reviewed. No pertinent family history.   Social History         Socioeconomic History   Marital status: Divorced      Spouse name: Not on file   Number of children: Not on file   Years of education: Not on file   Highest education level: Not on file   Occupational History   Not on file  Tobacco Use   Smoking status: Every Day      Types: Cigarettes      Passive exposure: Current   Smokeless tobacco: Never  Vaping Use   Vaping Use: Not on file  Substance and Sexual Activity   Alcohol use: Yes  Comment: daily   Drug use: Never   Sexual activity: Not Currently  Other Topics Concern   Not on file  Social History Narrative   Not on file    Social Determinants of Health    Financial Resource Strain: Not on file  Food Insecurity: Not on file  Transportation Needs: Not on file  Physical Activity: Not on file  Stress: Not on file  Social Connections: Not on file  Intimate Partner Violence: Not on file      REVIEW OF SYSTEMS: Constitutional: Weakness when she stands up.  Has not been experience general fatigue. ENT:  No nose bleeds Pulm: No shortness of breath.  No cough. CV:  No palpitations, no LE edema.  No angina GU:  No hematuria, no frequency.  No dark-colored urine. GI: As per HPI. Heme: Other than the GI bleeding, no unusual bleeding or bruising. Transfusions: Per HPI. Neuro:  No headaches, no peripheral tingling or numbness MS: Pain in legs and feet, especially the left leg.  This is not new but is currently flaring.  She has been told she had sciatica by her PCP. Derm:  No itching, no rash or sores.  Endocrine:  No sweats or chills.  No polyuria or dysuria Immunization: Reviewed. Travel:  None beyond local counties in last few months.      PHYSICAL EXAM: Vital signs in last 24 hours:     Vitals:    05/22/22 0615 05/22/22 0645  BP: 130/86 102/62  Pulse: 77 77  Resp: 17 15  Temp:      SpO2: 100% 98%       Wt Readings from Last 3 Encounters:  05/21/22 53 kg  05/15/22 53 kg      General: Pleasant, calm, looks somewhat ill but not toxic. Head: No facial asymmetry or swelling.  No signs of head trauma. Eyes: No conjunctival pallor.  No scleral icterus Ears: Not hard of hearing Nose: No congestion  or discharge Mouth: Most dentition intact.  Dental caries present.  Tongue midline.  Mucosa moist, pink, clear. Neck: No JVD, no masses, no thyromegaly Lungs: Clear bilaterally.  No labored breathing or cough. Heart: RRR.  No MRG.  S1, S2 present. Abdomen: Soft.  Not tender.  Not distended.  No HSM, masses, bruits, hernias..   Rectal: Deferred. Musc/Skeltl: No joint redness, swelling or gross deformities. Extremities: Both feet are warm.  There is no lower extremity edema. Neurologic: Fully alert and oriented.  Able to provide excellent history.  No visible tremors or gross deficits, formal strength testing not performed but moves all 4 limbs without issue. Skin: No rash, no sores, no suspicious lesions. Nodes: No cervical adenopathy Psych: Pleasant, calm, cooperative.   Intake/Output from previous day: 07/17 0701 - 07/18 0700 In: 1945 [Blood:945; IV Piggyback:1000] Out: 2100 [Urine:2100] Intake/Output this shift: No intake/output data recorded.   LAB RESULTS: Recent Labs (last 2 labs)      Recent Labs    05/21/22 2314  WBC 7.9  HGB 5.5*  HCT 15.6*  PLT 301       BMET Recent Labs       Lab Results  Component Value Date    NA 119 (LL) 05/21/2022    NA 136 05/15/2022    NA 127 (L) 05/14/2022    K 3.6 05/21/2022    K 3.4 (L) 05/15/2022    K 4.3 05/14/2022    CL 87 (L) 05/21/2022    CL 107 05/15/2022    CL 99  05/14/2022    CO2 20 (L) 05/21/2022    CO2 19 (L) 05/15/2022    CO2 16 (L) 05/14/2022    GLUCOSE 127 (H) 05/21/2022    GLUCOSE 80 05/15/2022    GLUCOSE 84 05/14/2022    BUN 14 05/21/2022    BUN 11 05/15/2022    BUN 24 (H) 05/14/2022    CREATININE 0.83 05/21/2022    CREATININE 0.82 05/15/2022    CREATININE 0.88 05/14/2022    CALCIUM 8.2 (L) 05/21/2022    CALCIUM 8.5 (L) 05/15/2022    CALCIUM 8.8 (L) 05/14/2022      LFT Recent Labs (last 2 labs)   No results for input(s): "PROT", "ALBUMIN", "AST", "ALT", "ALKPHOS", "BILITOT", "BILIDIR", "IBILI" in the  last 72 hours.   PT/INR Recent Labs  No results found for: "INR", "PROTIME"   Hepatitis Panel Recent Labs (last 2 labs)   No results for input(s): "HEPBSAG", "HCVAB", "HEPAIGM", "HEPBIGM" in the last 72 hours.   C-Diff Recent Labs  No components found for: "CDIFF"   Lipase  Labs (Brief)  No results found for: "LIPASE"     Drugs of Abuse  Labs (Brief)  No results found for: "LABOPIA", "COCAINSCRNUR", "LABBENZ", "AMPHETMU", "THCU", "LABBARB"      RADIOLOGY STUDIES: Imaging Results (Last 48 hours)  No results found.         IMPRESSION:       Painless hematochezia.   Bleeding activity slowing with last episode 10 PM almost 12 hours ago.  Not currently within the window to pursue bleeding scan or CT angio.   Hyponatremia.  Urine sodium normal.   Blood loss anemia.   Pleated PRBC x 2.  Awaiting recheck CBC.   Heavy beer consumer.  Slowed down since recent admission had 1 beer since discharge last week compared to norm of about a sixpack daily.  Platelets, LFTs okay.     L leg and foot pain.  Prior diagnosis of sciatica.     PLAN:      If she develops recurrent hematochezia, should go for stat CT angio to try to isolate source of bleeding.  Dr. Loletha Carrow not planning to pursue colonoscopy.   He will be seeing patient soon.  Allow clear liquids.  Leave Protonix drip in place for now though may not be necessary or could convert to once or twice daily Protonix.  Lower likelihood that this is rapid upper GI bleed     Azucena Freed  05/22/2022, 10:13 AM Phone [phone]    I have taken an interval history, thoroughly reviewed the chart and examined the patient. I agree with the Advanced Practitioner's note, impression and recommendations, and have recorded additional findings, impressions and recommendations below. I performed a substantive portion of this encounter (>50% time spent), including a complete performance of the medical decision making.  My additional thoughts  are as follows:  Recurrent large-volume hematochezia the patient is describing as bright red blood several days after hospitalization for diverticular bleeding.  2 diminutive colon polyps were removed by cold snare polypectomy, and I think it is very unlikely she is having post polypectomy bleeding. (I have made a change to the PA's initial note to reflect that)  Nor do I think this is upper GI bleeding.  So I am not currently planning an upper endoscopy or a repeat colonoscopy.  I explained the nature of diverticular bleeding to her and her daughter, particularly how it is intermittent and difficult to find on both endoscopy and  CT imaging.  If she has another episode of bright red blood per rectum, please send her for stat abdominal/pelvic CT angiogram in an attempt to localize the bleeding source.  If that scan sees active bleeding, our radiology colleagues should immediately contact interventional radiology in hopes of performing an angiogram as soon as possible to localize and treat the bleeding.  Serial hemoglobin and hematocrit, clear liquid diet, we will watch her closely and appreciate care of the hospitalist service.  Nelida Meuse III Office:[phone]

## 2022-05-22 NOTE — Progress Notes (Deleted)
[facility] Consult: 8:20 AM 05/22/2022  LOS: 0 days    Referring Provider: Dr Waldron Labs  Primary Care Physician:  [facility], [place] Primary Gastroenterologist:  Dr. Bryan Lemma  Reason for Consultation: GI bleed, anemia.  Colon polypectomies a week ago.   HPI: Ariel Bridges is a 61 y.o. female.  PMH alcohol abuse.  Smoker.  3 C-sections 1 involving appendectomy. Admission a week ago when seen by GI for hematochezia with fleeting abdominal pain, Hgb 8.5, hyponatremia.  Reported drinking 6 pack of beer daily and taking 3-4 BC powders daily.  05/15/2022 colonoscopy: A 6 mm polyp was removed from transverse colon, 4 mm polyp removed from sigmoid (path for both: inflammatory polyps). Focal erythema surrounding a solitary sigmoid diverticulum, suspicious for recent diverticulitis.  Dr. Bryan Lemma felt that given clinical improvement and current guidelines, antimicrobial therapy was not necessary.  Discharged after 3-day admission with plans for follow-up CBC, be met in 7 to 10 days.  Labs did not confirm B12, folate or iron deficiency.  Glucose levels moderately elevated and A1c moderately elevated.  HCV, HIV negative.  She did not undergo GI imaging.  8/23 ROV w Dr C.  After discharge last week patient resumed having brown stools.  Appetite somewhat depressed but no nausea, vomiting.  Went to work over the weekend without a problem.  Starting Sunday developed recurrent hematochezia, fecal urgency, some.  Called GI office and advised to follow clear liquids, rest.  Proceed to ED or PCP if situation worsened, symptoms persisted.  Then experienced positional dizziness, tachycardia.  Came to ED.  She had 1 beer over the weekend.  Had not resumed taking any aspirin products since prior to recent admission. Blood  pressures 120s to 130s/70s to 80s.  Heart rate 70s to 80s.  Room air sats excellent.  No fever. Hgb 5.5.  Was 8 a week ago at discharge.   Na 119 (nadir 121 last week).  BUNs/creatinine (BUN to 28 last week), potassium normal.  T. bili, alk phos, transaminases normal.  Protein and albumin levels reduced. Urine sodium 41, normal.  S/p 2 PRBCs.  Repeat Hgb pending.  PPI drip initiated.  Generally consumes 6 pack of beer daily.  Works as a Educational psychologist.  1 pack/day smoker No family history of colorectal cancer, colitis, gastritis, ulcer disease, anemia.    Past Medical History:  Diagnosis Date   Alcohol abuse    Colon polyps    Diverticulosis    Hypertension    Lower GI bleed    Smoker     Past Surgical History:  Procedure Laterality Date   CESAREAN SECTION     COLONOSCOPY WITH PROPOFOL N/A 05/15/2022   Procedure: COLONOSCOPY WITH PROPOFOL;  Surgeon: Lavena Bullion, DO;  Location: [place];  Service: Gastroenterology;  Laterality: N/A;   POLYPECTOMY  05/15/2022   Procedure: POLYPECTOMY;  Surgeon: Lavena Bullion, DO;  Location: [facility];  Service: Gastroenterology;;    Prior to Admission medications   Medication Sig Start Date End Date Taking? Authorizing Provider  acetaminophen (TYLENOL) 325 MG tablet  Take 2 tablets (650 mg total) by mouth every 6 (six) hours as needed for mild pain (or Fever >/= 101). 05/15/22  Yes Eugenie Filler, MD  amLODipine (NORVASC) 5 MG tablet Take 5 mg by mouth at bedtime. 04/19/22  Yes [provider]  atenolol (TENORMIN) 50 MG tablet Take 50 mg by mouth at bedtime. 05/02/22  Yes [provider]  Calcium Carbonate Antacid (TUMS PO) Take 1 tablet by mouth See admin instructions. Take 1 tablet 2-3 times a day as needed for acid reflux   Yes [provider]  cyclobenzaprine (FLEXERIL) 10 MG tablet Take 10 mg by mouth at bedtime. 04/19/22  Yes [provider]  diphenhydramine-acetaminophen (TYLENOL PM EXTRA  STRENGTH) 25-500 MG TABS tablet Take 0.5 tablets by mouth at bedtime.   Yes [provider]  folic acid (FOLVITE) 1 MG tablet Take 1 tablet (1 mg total) by mouth daily. 05/16/22  Yes Eugenie Filler, MD  indomethacin (INDOCIN) 25 MG capsule Take 25 mg by mouth 2 (two) times daily as needed (gout). 04/09/22  Yes [provider]  lisinopril-hydrochlorothiazide (ZESTORETIC) 20-12.5 MG tablet Take 2 tablets by mouth at bedtime. 05/02/22  Yes [provider]  Multiple Vitamin (MULTIVITAMIN WITH MINERALS) TABS tablet Take 1 tablet by mouth daily. 05/16/22  Yes Eugenie Filler, MD  pantoprazole (PROTONIX) 40 MG tablet Take 1 tablet (40 mg total) by mouth daily at 6 (six) AM. 05/16/22  Yes Eugenie Filler, MD  thiamine 100 MG tablet Take 1 tablet (100 mg total) by mouth daily. 05/16/22  Yes Eugenie Filler, MD  Aspirin-Salicylamide-Caffeine [facility] HEADACHE POWDER PO) Take 1 packet by mouth in the morning, at noon, in the evening, and at bedtime. Patient not taking: Reported on 05/22/2022    [provider]    Scheduled Meds:  [START ON 12/28/3610] folic acid  1 mg Oral Daily   [START ON 05/23/2022] multivitamin with minerals  1 tablet Oral Daily   [START ON 05/25/2022] pantoprazole  40 mg Intravenous Q12H   thiamine injection  100 mg Intravenous Q24H   Infusions:  lactated ringers Stopped (05/22/22 0442)   pantoprazole 8 mg/hr (05/22/22 0650)   PRN Meds: acetaminophen **OR** acetaminophen, LORazepam **OR** LORazepam, nicotine   Allergies as of 05/21/2022 - Review Complete 05/21/2022  Allergen Reaction Noted   Latex Itching and Other (See Comments) 05/14/2022    History reviewed. No pertinent family history.  Social History   Socioeconomic History   Marital status: Divorced    Spouse name: Not on file   Number of children: Not on file   Years of education: Not on file   Highest education level: Not on file  Occupational History   Not on file  Tobacco  Use   Smoking status: Every Day    Types: Cigarettes    Passive exposure: Current   Smokeless tobacco: Never  Vaping Use   Vaping Use: Not on file  Substance and Sexual Activity   Alcohol use: Yes    Comment: daily   Drug use: Never   Sexual activity: Not Currently  Other Topics Concern   Not on file  Social History Narrative   Not on file   Social Determinants of Health   Financial Resource Strain: Not on file  Food Insecurity: Not on file  Transportation Needs: Not on file  Physical Activity: Not on file  Stress: Not on file  Social Connections: Not on file  Intimate Partner Violence: Not on file  REVIEW OF SYSTEMS: Constitutional: Weakness when she stands up.  Has not been experience general fatigue. ENT:  No nose bleeds Pulm: No shortness of breath.  No cough. CV:  No palpitations, no LE edema.  No angina GU:  No hematuria, no frequency.  No dark-colored urine. GI: As per HPI. Heme: Other than the GI bleeding, no unusual bleeding or bruising. Transfusions: Per HPI. Neuro:  No headaches, no peripheral tingling or numbness MS: Pain in legs and feet, especially the left leg.  This is not new but is currently flaring.  She has been told she had sciatica by her PCP. Derm:  No itching, no rash or sores.  Endocrine:  No sweats or chills.  No polyuria or dysuria Immunization: Reviewed. Travel:  None beyond local counties in last few months.    PHYSICAL EXAM: Vital signs in last 24 hours: Vitals:   05/22/22 0615 05/22/22 0645  BP: 130/86 102/62  Pulse: 77 77  Resp: 17 15  Temp:    SpO2: 100% 98%   Wt Readings from Last 3 Encounters:  05/21/22 53 kg  05/15/22 53 kg    General: Pleasant, calm, looks somewhat ill but not toxic. Head: No facial asymmetry or swelling.  No signs of head trauma. Eyes: No conjunctival pallor.  No scleral icterus Ears: Not hard of hearing Nose: No congestion or discharge Mouth: Most dentition intact.  Dental caries present.   Tongue midline.  Mucosa moist, pink, clear. Neck: No JVD, no masses, no thyromegaly Lungs: Clear bilaterally.  No labored breathing or cough. Heart: RRR.  No MRG.  S1, S2 present. Abdomen: Soft.  Not tender.  Not distended.  No HSM, masses, bruits, hernias..   Rectal: Deferred. Musc/Skeltl: No joint redness, swelling or gross deformities. Extremities: Both feet are warm.  There is no lower extremity edema. Neurologic: Fully alert and oriented.  Able to provide excellent history.  No visible tremors or gross deficits, formal strength testing not performed but moves all 4 limbs without issue. Skin: No rash, no sores, no suspicious lesions. Nodes: No cervical adenopathy Psych: Pleasant, calm, cooperative.  Intake/Output from previous day: 07/17 0701 - 07/18 0700 In: 1945 [Blood:945; IV Piggyback:1000] Out: 2100 [Urine:2100] Intake/Output this shift: No intake/output data recorded.  LAB RESULTS: Recent Labs    05/21/22 2314  WBC 7.9  HGB 5.5*  HCT 15.6*  PLT 301   BMET Lab Results  Component Value Date   NA 119 (LL) 05/21/2022   NA 136 05/15/2022   NA 127 (L) 05/14/2022   K 3.6 05/21/2022   K 3.4 (L) 05/15/2022   K 4.3 05/14/2022   CL 87 (L) 05/21/2022   CL 107 05/15/2022   CL 99 05/14/2022   CO2 20 (L) 05/21/2022   CO2 19 (L) 05/15/2022   CO2 16 (L) 05/14/2022   GLUCOSE 127 (H) 05/21/2022   GLUCOSE 80 05/15/2022   GLUCOSE 84 05/14/2022   BUN 14 05/21/2022   BUN 11 05/15/2022   BUN 24 (H) 05/14/2022   CREATININE 0.83 05/21/2022   CREATININE 0.82 05/15/2022   CREATININE 0.88 05/14/2022   CALCIUM 8.2 (L) 05/21/2022   CALCIUM 8.5 (L) 05/15/2022   CALCIUM 8.8 (L) 05/14/2022   LFT No results for input(s): "PROT", "ALBUMIN", "AST", "ALT", "ALKPHOS", "BILITOT", "BILIDIR", "IBILI" in the last 72 hours. PT/INR No results found for: "INR", "PROTIME" Hepatitis Panel No results for input(s): "HEPBSAG", "HCVAB", "HEPAIGM", "HEPBIGM" in the last 72 hours. C-Diff No  components found for: "CDIFF" Lipase  No  results found for: "LIPASE"  Drugs of Abuse  No results found for: "LABOPIA", "COCAINSCRNUR", "LABBENZ", "AMPHETMU", "THCU", "LABBARB"   RADIOLOGY STUDIES: No results found.    IMPRESSION:      Painless hematochezia.  Most likely this is due to post polypectomy bleed.  Bleeding activity slowing with last episode 10 PM almost 12 hours ago.  Not currently within the window to pursue bleeding scan or CT angio.  Hyponatremia.  Urine sodium normal.  Blood loss anemia.   Pleated PRBC x 2.  Awaiting recheck CBC.  Heavy beer consumer.  Slowed down since recent admission had 1 beer since discharge last week compared to norm of about a sixpack daily.  Platelets, LFTs okay.    L leg and foot pain.  Prior diagnosis of sciatica.   PLAN:     If she develops recurrent hematochezia, should go for stat CT angio to try to isolate source of bleeding.  Dr. Loletha Carrow not planning to pursue colonoscopy.   He will be seeing patient soon.  Allow clear liquids.  Leave Protonix drip in place for now though may not be necessary or could convert to once or twice daily Protonix.  Lower likelihood that this is rapid upper GI bleed   Azucena Freed  05/22/2022, 8:20 AM Phone [phone]

## 2022-05-22 NOTE — ED Notes (Signed)
Pt ate her full clear liquid tray.

## 2022-05-22 NOTE — ED Notes (Signed)
Rate increased to 200 ml/hr for blood transfusion

## 2022-05-22 NOTE — ED Notes (Signed)
Pt appears to be sleeping, even RR and unlabored, NAD noted, call bell in reach, side rails up x2 for safety, care on going, will continue to monitor. Daughter at the bedside, blood is still transfusing at this time, no s/s or reaction observed.

## 2022-05-22 NOTE — Progress Notes (Signed)
PROGRESS NOTE    Ariel Bridges  GUY:[id number] DOB: 08/27/1961 DOA: 05/21/2022 PCP: Practice, [facility]    Chief Complaint  Patient presents with   GI Bleeding    Brief Narrative:   Ariel Bridges is a 61 y.o. female with medical history significant for recent hospitalization for acute lower gastrointestinal bleed, diverticulosis , essential hypertension, chronic alcohol abuse , who is admitted to [facility] on 05/21/2022 with suspected acute lower gastrointestinal bleed after presenting from home to [facility] ED complaining of maroon appearing stool.  -Patient with recent hospitalization  from 05/13/2022 - 05/15/22 with acute lower gastrointestinal bleed associated with acute blood loss anemia, during which time underwent colonoscopy with Dr. Bryan Lemma of [facility] gastroenterology on 05/15/22, with this evaluation notable for nonbleeding 6 mm polyp in the transverse colon as well as nonbleeding 4 mm polyp in the sigmoid colon both of which were resected and removed.  Colonoscopy also notable for diverticulosis throughout the entire colon, without evidence of active bleed, while noting a single diverticulum in the sigmoid colon with some surrounding focal erythema, but in the absence of overt active bleed.  No EGD at that time.  -Parents with bright red blood per rectum, hemoglobin was noted to be low at 5.5, as well her work-up significant for low sodium at 119, she endorses significant whole abuse with beer.        Assessment & Plan:   Principal Problem:   Acute lower GI bleeding Active Problems:   Tobacco abuse   Alcohol abuse   Essential hypertension   Acute blood loss anemia   Acute hyponatremia   Acute lower GI bleed Painless hematochezia Acute blood loss anemia -Hemoglobin is significantly low at 5.5 on admission, symptomatic, she was transfused 2 units PRBC, will follow CBC closely and transfuse for target hemoglobin> 8. -Continue with Protonix drip for  now. -GI input greatly appreciated, patient with recurrent GI bleed, then plan for stat CT angio to try to isolate source of bleed. -Auditor CBC closely and transfuse as needed. -Keep on clear liquid diet  Hyponatremia -Most likely due to beer potomania -Continue with IV fluid, monitor BMP closely  Alcohol abuse -Continue with CIWA protocol -Continue with thiamine and folic acid -Last drink was 48 hours ago, recent hospitalization with no evidence of withdrawals  Hypertension -Hold home medication due to above, will keep on as needed hydralazine  Chronic tobacco abuse -She was counseled   DVT prophylaxis: SCD's Code Status: Full Family Communication: None at bedside Disposition:   Status is: Observation The patient will require care spanning > 2 midnights and should be moved to inpatient because: She is with hyponatremia, acute blood loss anemia, will need close monitoring, and further work-up.   Consultants:  Gastroenterology   Subjective:  No nausea, no vomiting, reports generalized weakness, fatigue.  Objective: Vitals:   05/22/22 0545 05/22/22 0611 05/22/22 0615 05/22/22 0645  BP: 129/85 139/87 130/86 102/62  Pulse: 75 75 77 77  Resp: '19 14 17 15  '$ Temp:  98.2 F (36.8 C)    TempSrc:  Oral    SpO2: 100% 99% 100% 98%  Weight:      Height:        Intake/Output Summary (Last 24 hours) at 05/22/2022 0927 Last data filed at 05/22/2022 0611 Gross per 24 hour  Intake 1945 ml  Output 2100 ml  Net -155 ml   Filed Weights   05/21/22 2243  Weight: 53 kg    Examination:  General exam: Appears calm and comfortable  Respiratory system: Clear to auscultation. Respiratory effort normal. Cardiovascular system: S1 & S2 heard, RRR. No JVD, murmurs, rubs, gallops or clicks. No pedal edema. Gastrointestinal system: Abdomen is nondistended, soft and nontender. No organomegaly or masses felt. Normal bowel sounds heard. Central nervous system: Alert and oriented. No  focal neurological deficits. Extremities: Symmetric 5 x 5 power. Skin: No rashes, lesions or ulcers Psychiatry: Judgement and insight appear normal. Mood & affect appropriate.     Data Reviewed: I have personally reviewed following labs and imaging studies  CBC: Recent Labs  Lab 05/15/22 1454 05/21/22 2314  WBC  --  7.9  NEUTROABS  --  5.9  HGB 8.0* 5.5*  HCT 23.1* 15.6*  MCV  --  96.3  PLT  --  009    Basic Metabolic Panel: Recent Labs  Lab 05/21/22 2314  NA 119*  K 3.6  CL 87*  CO2 20*  GLUCOSE 127*  BUN 14  CREATININE 0.83  CALCIUM 8.2*    GFR: Estimated Creatinine Clearance: 58.8 mL/min (by C-G formula based on SCr of 0.83 mg/dL).  Liver Function Tests: No results for input(s): "AST", "ALT", "ALKPHOS", "BILITOT", "PROT", "ALBUMIN" in the last 168 hours.  CBG: No results for input(s): "GLUCAP" in the last 168 hours.   No results found for this or any previous visit (from the past 240 hour(s)).       Radiology Studies: No results found.      Scheduled Meds:  [START ON 2/33/0076] folic acid  1 mg Oral Daily   [START ON 05/23/2022] multivitamin with minerals  1 tablet Oral Daily   [START ON 05/25/2022] pantoprazole  40 mg Intravenous Q12H   thiamine injection  100 mg Intravenous Q24H   Continuous Infusions:  lactated ringers Stopped (05/22/22 0442)   pantoprazole 8 mg/hr (05/22/22 0650)     LOS: 0 days       Phillips Climes, MD [facility]   To contact the attending provider between 7A-7P or the covering provider during after hours 7P-7A, please log into the web site [email or web address] and access using universal  password for that web site. If you do not have the password, please call the hospital operator.  05/22/2022, 9:27 AM

## 2022-05-22 NOTE — ED Notes (Signed)
2nd blood transfusion completed  [id number] [phone]

## 2022-05-22 NOTE — ED Notes (Signed)
Pt appears to be sleeping, even RR and unlabored, NAD noted, call bell in reach, side rails up x2 for safety, care on going, will continue to monitor. No s/s of blood transfusion reaction noted

## 2022-05-22 NOTE — ED Notes (Signed)
Pt resting, NAD noted,  Daughter at the bedside, blood is still transfusing at this time, no s/s or reaction observed. Will attempt to increase pt's blood transfusion and monitor for toleration.

## 2022-05-22 NOTE — ED Notes (Signed)
2nd blood transfusion started [id number]

## 2022-05-23 ENCOUNTER — Other Ambulatory Visit: Payer: Self-pay

## 2022-05-23 DIAGNOSIS — K5731 Diverticulosis of large intestine without perforation or abscess with bleeding: Secondary | ICD-10-CM

## 2022-05-23 LAB — CBC
HCT: 21.2 % — ABNORMAL LOW (ref 36.0–46.0)
HCT: 23.1 % — ABNORMAL LOW (ref 36.0–46.0)
Hemoglobin: 7.5 g/dL — ABNORMAL LOW (ref 12.0–15.0)
Hemoglobin: 8.1 g/dL — ABNORMAL LOW (ref 12.0–15.0)
MCH: 30.6 pg (ref 26.0–34.0)
MCH: 30.9 pg (ref 26.0–34.0)
MCHC: 35.4 g/dL (ref 30.0–36.0)
MCHC: 35.1 g/dL (ref 30.0–36.0)
MCV: 86.5 fL (ref 80.0–100.0)
MCV: 88.2 fL (ref 80.0–100.0)
Platelets: 268 10*3/uL (ref 150–400)
Platelets: 302 10*3/uL (ref 150–400)
RBC: 2.45 MIL/uL — ABNORMAL LOW (ref 3.87–5.11)
RBC: 2.62 MIL/uL — ABNORMAL LOW (ref 3.87–5.11)
RDW: 18.4 % — ABNORMAL HIGH (ref 11.5–15.5)
RDW: 18.6 % — ABNORMAL HIGH (ref 11.5–15.5)
WBC: 6.3 10*3/uL (ref 4.0–10.5)
WBC: 7.9 10*3/uL (ref 4.0–10.5)
nRBC: 0 % (ref 0.0–0.2)
nRBC: 0 % (ref 0.0–0.2)

## 2022-05-23 LAB — BASIC METABOLIC PANEL
Anion gap: 6 (ref 5–15)
BUN: 7 mg/dL — ABNORMAL LOW (ref 8–23)
CO2: 22 mmol/L (ref 22–32)
Calcium: 8.3 mg/dL — ABNORMAL LOW (ref 8.9–10.3)
Chloride: 104 mmol/L (ref 98–111)
Creatinine, Ser: 0.82 mg/dL (ref 0.44–1.00)
GFR, Estimated: >60 mL/min (ref >60)
Glucose, Bld: 89 mg/dL (ref 70–99)
Potassium: 3.7 mmol/L (ref 3.5–5.1)
Sodium: 132 mmol/L — ABNORMAL LOW (ref 135–145)

## 2022-05-23 LAB — HEMOGLOBIN AND HEMATOCRIT, BLOOD
HCT: 22.7 % — ABNORMAL LOW (ref 36.0–46.0)
Hemoglobin: 7.7 g/dL — ABNORMAL LOW (ref 12.0–15.0)

## 2022-05-23 MED ORDER — TRAZODONE HCL 50 MG PO TABS
25.0000 mg | ORAL_TABLET | Freq: Once | ORAL | Status: AC
  Administered 2022-05-23: 25 mg via ORAL
  Filled 2022-05-23: qty 1

## 2022-05-23 MED ORDER — MAGNESIUM SULFATE 2 GM/50ML IV SOLN
2.0000 g | Freq: Once | INTRAVENOUS | Status: AC
  Administered 2022-05-23: 2 g via INTRAVENOUS
  Filled 2022-05-23: qty 50

## 2022-05-23 MED ORDER — BOOST / RESOURCE BREEZE PO LIQD CUSTOM
1.0000 | Freq: Three times a day (TID) | ORAL | Status: DC
  Administered 2022-05-23 (×3): 1 via ORAL

## 2022-05-23 NOTE — TOC Progression Note (Signed)
Transition of Care [facility]) - Progression Note    Patient Details  Name: Ariel Bridges MRN: [id number] Date of Birth: May 23, 1961  Transition of Care [facility]) CM/SW Contact  Zenon Mayo, RN Phone Number: 05/23/2022, 1:01 PM  Clinical Narrative:    from home with family, indep, GIB, hgb 5.5 upon arrival, got 2 units in ED, hgb is now 8.1 now, got IV Mag, will keep overnight to see if will have BM- to see if still bleeding, poss dc tomorrow. TOC following.        Expected Discharge Plan and Services                                                 Social Determinants of Health (SDOH) Interventions    Readmission Risk Interventions     No data to display

## 2022-05-23 NOTE — Progress Notes (Addendum)
[facility]  PROGRESS NOTE  Ariel Bridges GLO:[id number] DOB: 10-27-1961 DOA: 05/21/2022 PCP: Practice, [facility]   Brief HPI:   61 year old female with history of recent hospitalization for acute lower GI bleed, diverticulosis, essential hypertension, chronic alcohol abuse who was admitted to [facility] on 05/21/2022 with suspected acute lower GI bleed after presenting from home to [facility], ED complaining of maroon appearing stool. -Patient with recent hospitalization  from 05/13/2022 - 05/15/22 with acute lower gastrointestinal bleed associated with acute blood loss anemia, during which time underwent colonoscopy with Dr. Bryan Lemma of [facility] gastroenterology on 05/15/22, with this evaluation notable for nonbleeding 6 mm polyp in the transverse colon as well as nonbleeding 4 mm polyp in the sigmoid colon both of which were resected and removed.  Colonoscopy also notable for diverticulosis throughout the entire colon, without evidence of active bleed, while noting a single diverticulum in the sigmoid colon with some surrounding focal erythema, but in the absence of overt active bleed.  No EGD at that time.  In the ED hemoglobin was found to be 5.5.  Also found to have low sodium 119.  Patient also significant use of beer.   Subjective   Denies further bleeding in the hospital.   Assessment/Plan:   Acute lower GI bleed -Painless hematochezia -Acute blood loss anemia -Hemoglobin was found to be 5.5; s/p 2 units of PRBC -Hemoglobin is up to 8.1 today -Was started on Protonix gtt. which has been discontinued per GI -GI recommends to obtain stat hemoglobin and sent for stat CT angiogram of abdomen if patient has another episode of hematochezia -No repeat endoscopic evaluation planned during this admission; likely from diverticular bleed -Continue clear liquid diet  Hyponatremia -Significantly improved -Likely from beer potomania -Continue LR at 50 mill per  hour  Alcohol abuse -Continue with CIWA protocol -Continue thiamine folate -No signs and symptoms of alcohol withdrawal  Hypertension -Home medication on hold due to soft BP -Continue as needed hydralazine   Medications     feeding supplement  1 Container Oral TID BM   folic acid  1 mg Oral Daily   multivitamin with minerals  1 tablet Oral Daily   thiamine injection  100 mg Intravenous Q24H     Data Reviewed:   CBG:  No results for input(s): "GLUCAP" in the last 168 hours.  SpO2: 99 %    Vitals:   05/23/22 0015 05/23/22 0347 05/23/22 0547 05/23/22 0812  BP: 106/65 104/65  100/67  Pulse: 81 73  83  Resp:  15  19  Temp:  98.3 F (36.8 C)  (!) 97.4 F (36.3 C)  TempSrc:  Oral  Oral  SpO2:  95%  99%  Weight:   53.7 kg   Height:          Data Reviewed:  Basic Metabolic Panel: Recent Labs  Lab 05/21/22 2314 05/22/22 1034 05/22/22 1555 05/23/22 0345  NA 119* 131* 132* 132*  K 3.6 3.9  --  3.7  CL 87* 99  --  104  CO2 20* 22  --  22  GLUCOSE 127* 91  --  89  BUN 14 11  --  7*  CREATININE 0.83 0.82  --  0.82  CALCIUM 8.2* 8.6*  --  8.3*  MG  --   --  1.6*  --     CBC: Recent Labs  Lab 05/21/22 2314 05/22/22 1034 05/22/22 1443 05/22/22 1555 05/23/22 0345 05/23/22 0731  WBC 7.9 6.5  --  8.1 6.3 7.9  NEUTROABS 5.9 5.1  --   --   --   --   HGB 5.5* 8.4* 8.4* 8.2* 7.5* 8.1*  HCT 15.6* 23.2* 24.0* 22.5* 21.2* 23.1*  MCV 96.3 86.6  --  85.6 86.5 88.2  PLT 301 289  --  290 268 302    LFT Recent Labs  Lab 05/22/22 1034  AST 23  ALT 14  ALKPHOS 63  BILITOT 0.6  PROT 5.0*  ALBUMIN 2.8*     Antibiotics: Anti-infectives (From admission, onward)    None        DVT prophylaxis: SCDs  Code Status: Full code  Family Communication: No family at bedside   CONSULTS gastroenterology   Objective    Physical Examination:   General-appears in no acute distress Heart-S1-S2, regular, no murmur auscultated Lungs-clear to  auscultation bilaterally, no wheezing or crackles auscultated Abdomen-soft, nontender, no organomegaly Extremities-no edema in the lower extremities Neuro-alert, oriented x3, no focal deficit noted  Status is: Inpatient: GI bleed       [place]   [facility] If 7PM-7AM, please contact night-coverage at [email or web address], Office  [phone]   05/23/2022, 4:14 PM  LOS: 1 day

## 2022-05-23 NOTE — Progress Notes (Signed)
[facility] Progress Note  Chief Complaint: Hematochezia  History:  No hematochezia since admission, no abdominal pain, no clinical events since I saw her yesterday.  She remains on a clear liquid diet, serial hemoglobins have been stable.  ROS: Cardiovascular: Denies chest pain Respiratory: Denies dyspnea Urinary: Denies dysuria  Objective:   Current Facility-Administered Medications:    acetaminophen (TYLENOL) tablet 650 mg, 650 mg, Oral, Q6H PRN **OR** acetaminophen (TYLENOL) suppository 650 mg, 650 mg, Rectal, Q6H PRN, Howerter, Justin B, DO   folic acid (FOLVITE) tablet 1 mg, 1 mg, Oral, Daily, Howerter, Justin B, DO   hydrALAZINE (APRESOLINE) injection 5 mg, 5 mg, Intravenous, Q6H PRN, Elgergawy, Silver Huguenin, MD   lactated ringers infusion, , Intravenous, Continuous, Howerter, Justin B, DO, Last Rate: 50 mL/hr at 05/23/22 0346, New Bag at 05/23/22 0346   LORazepam (ATIVAN) tablet 1-4 mg, 1-4 mg, Oral, Q1H PRN, 1 mg at 05/22/22 2103 **OR** LORazepam (ATIVAN) injection 1-4 mg, 1-4 mg, Intravenous, Q1H PRN, Howerter, Justin B, DO   multivitamin with minerals tablet 1 tablet, 1 tablet, Oral, Daily, Howerter, Justin B, DO   nicotine (NICODERM CQ - dosed in mg/24 hours) patch 14 mg, 14 mg, Transdermal, Daily PRN, Howerter, Justin B, DO   thiamine (B-1) injection 100 mg, 100 mg, Intravenous, Q24H, Howerter, Justin B, DO, 100 mg at 05/22/22 1031   lactated ringers 50 mL/hr at 05/23/22 0346     Vital signs in last 24 hrs: Vitals:   05/23/22 0347 05/23/22 0812  BP: 104/65 100/67  Pulse: 73 83  Resp: 15 19  Temp: 98.3 F (36.8 C) (!) 97.4 F (36.3 C)  SpO2: 95% 99%    Intake/Output Summary (Last 24 hours) at 05/23/2022 0848 Last data filed at 05/23/2022 0303 Gross per 24 hour  Intake 1026.56 ml  Output 4000 ml  Net -2973.44 ml     Physical Exam Alert, conversational, not acutely ill-appearing HEENT: sclera anicteric, oral mucosa without lesions Neck: supple, no thyromegaly,  JVD or lymphadenopathy Cardiac: RRR without murmurs, S1S2 heard, no peripheral edema Pulm: clear to auscultation bilaterally, normal RR and effort noted Abdomen: soft, no tenderness, with active bowel sounds. No guarding or palpable hepatosplenomegaly Skin; warm and dry, no jaundice. + pale  Recent Labs:     Latest Ref Rng & Units 05/23/2022    7:31 AM 05/23/2022    3:45 AM 05/22/2022    3:55 PM  CBC  WBC 4.0 - 10.5 K/uL 7.9  6.3  8.1   Hemoglobin 12.0 - 15.0 g/dL 8.1  7.5  8.2   Hematocrit 36.0 - 46.0 % 23.1  21.2  22.5   Platelets 150 - 400 K/uL 302  268  290     Recent Labs  Lab 05/22/22 1555  INR 1.1      Latest Ref Rng & Units 05/23/2022    3:45 AM 05/22/2022    3:55 PM 05/22/2022   10:34 AM  CMP  Glucose 70 - 99 mg/dL 89   91   BUN 8 - 23 mg/dL 7   11   Creatinine 0.44 - 1.00 mg/dL 0.82   0.82   Sodium 135 - 145 mmol/L 132  132  131   Potassium 3.5 - 5.1 mmol/L 3.7   3.9   Chloride 98 - 111 mmol/L 104   99   CO2 22 - 32 mmol/L 22   22   Calcium 8.9 - 10.3 mg/dL 8.3   8.6   Total Protein 6.5 - 8.1  g/dL   5.0   Total Bilirubin 0.3 - 1.2 mg/dL   0.6   Alkaline Phos 38 - 126 U/L   63   AST 15 - 41 U/L   23   ALT 0 - 44 U/L   14      Radiologic studies:   Assessment & Plan  Assessment: Hematochezia Acute blood loss anemia Recurrent colonic diverticular bleed  Clinically stable at present.  No current plans for repeat endoscopic procedures.  Plan: If she has another episode of hematochezia, please obtain a stat hemoglobin and send her for stat CT angiogram of the abdomen.  (See similar recommendations in my consult note of yesterday)  I have discontinued the every 6 hour hemoglobin and hematocrit checks.  We will check a CBC this evening and again this morning.  Continue clear liquid diet and added boost/Ensure.   Nelida Meuse III Office: [phone]

## 2022-05-24 ENCOUNTER — Inpatient Hospital Stay (HOSPITAL_COMMUNITY): Payer: 59

## 2022-05-24 DIAGNOSIS — R079 Chest pain, unspecified: Secondary | ICD-10-CM

## 2022-05-24 DIAGNOSIS — R933 Abnormal findings on diagnostic imaging of other parts of digestive tract: Secondary | ICD-10-CM

## 2022-05-24 LAB — BASIC METABOLIC PANEL
Anion gap: 7 (ref 5–15)
Anion gap: 12 (ref 5–15)
BUN: 10 mg/dL (ref 8–23)
BUN: 10 mg/dL (ref 8–23)
CO2: 22 mmol/L (ref 22–32)
CO2: 21 mmol/L — ABNORMAL LOW (ref 22–32)
Calcium: 7.8 mg/dL — ABNORMAL LOW (ref 8.9–10.3)
Calcium: 8.1 mg/dL — ABNORMAL LOW (ref 8.9–10.3)
Chloride: 105 mmol/L (ref 98–111)
Chloride: 104 mmol/L (ref 98–111)
Creatinine, Ser: 0.73 mg/dL (ref 0.44–1.00)
Creatinine, Ser: 0.74 mg/dL (ref 0.44–1.00)
GFR, Estimated: >60 mL/min (ref >60)
GFR, Estimated: >60 mL/min (ref >60)
Glucose, Bld: 98 mg/dL (ref 70–99)
Glucose, Bld: 94 mg/dL (ref 70–99)
Potassium: 3.3 mmol/L — ABNORMAL LOW (ref 3.5–5.1)
Potassium: 4 mmol/L (ref 3.5–5.1)
Sodium: 134 mmol/L — ABNORMAL LOW (ref 135–145)
Sodium: 137 mmol/L (ref 135–145)

## 2022-05-24 LAB — PREPARE RBC (CROSSMATCH)

## 2022-05-24 LAB — CBC
HCT: 18.6 % — ABNORMAL LOW (ref 36.0–46.0)
Hemoglobin: 6.3 g/dL — CL (ref 12.0–15.0)
MCH: 31 pg (ref 26.0–34.0)
MCHC: 33.9 g/dL (ref 30.0–36.0)
MCV: 91.6 fL (ref 80.0–100.0)
Platelets: 278 10*3/uL (ref 150–400)
RBC: 2.03 MIL/uL — ABNORMAL LOW (ref 3.87–5.11)
RDW: 18.3 % — ABNORMAL HIGH (ref 11.5–15.5)
WBC: 7.8 10*3/uL (ref 4.0–10.5)
nRBC: 0 % (ref 0.0–0.2)

## 2022-05-24 LAB — TROPONIN I (HIGH SENSITIVITY)
Troponin I (High Sensitivity): 9 ng/L (ref <18)
Troponin I (High Sensitivity): 23 ng/L — ABNORMAL HIGH (ref <18)

## 2022-05-24 LAB — HEMOGLOBIN AND HEMATOCRIT, BLOOD
HCT: 24.1 % — ABNORMAL LOW (ref 36.0–46.0)
Hemoglobin: 8.3 g/dL — ABNORMAL LOW (ref 12.0–15.0)

## 2022-05-24 MED ORDER — ZOLPIDEM TARTRATE 5 MG PO TABS
5.0000 mg | ORAL_TABLET | Freq: Once | ORAL | Status: AC
  Administered 2022-05-24: 5 mg via ORAL
  Filled 2022-05-24: qty 1

## 2022-05-24 MED ORDER — THIAMINE HCL 100 MG PO TABS
100.0000 mg | ORAL_TABLET | Freq: Every day | ORAL | Status: DC
  Administered 2022-05-24 – 2022-05-28 (×5): 100 mg via ORAL
  Filled 2022-05-24 (×5): qty 1

## 2022-05-24 MED ORDER — IOHEXOL 350 MG/ML SOLN
100.0000 mL | Freq: Once | INTRAVENOUS | Status: AC | PRN
  Administered 2022-05-24: 100 mL via INTRAVENOUS

## 2022-05-24 MED ORDER — SODIUM CHLORIDE 0.9% IV SOLUTION: Freq: Once | INTRAVENOUS | Status: AC

## 2022-05-24 MED ORDER — SODIUM CHLORIDE 0.9% IV SOLUTION
Freq: Once | INTRAVENOUS | Status: AC
Start: 2022-05-24 — End: 2022-05-24

## 2022-05-24 MED ORDER — POTASSIUM CHLORIDE CRYS ER 20 MEQ PO TBCR
40.0000 meq | EXTENDED_RELEASE_TABLET | Freq: Once | ORAL | Status: AC
  Administered 2022-05-24: 40 meq via ORAL
  Filled 2022-05-24: qty 2

## 2022-05-24 MED ORDER — CIPROFLOXACIN IN D5W 400 MG/200ML IV SOLN
400.0000 mg | Freq: Two times a day (BID) | INTRAVENOUS | Status: DC
  Administered 2022-05-24 – 2022-05-26 (×4): 400 mg via INTRAVENOUS
  Filled 2022-05-24 (×5): qty 200

## 2022-05-24 MED ORDER — METRONIDAZOLE 500 MG/100ML IV SOLN
500.0000 mg | Freq: Two times a day (BID) | INTRAVENOUS | Status: DC
  Administered 2022-05-24 – 2022-05-26 (×4): 500 mg via INTRAVENOUS
  Filled 2022-05-24 (×6): qty 100

## 2022-05-24 MED ORDER — TRAZODONE HCL 50 MG PO TABS
25.0000 mg | ORAL_TABLET | Freq: Once | ORAL | Status: DC
Start: 2022-05-24 — End: 2022-05-24

## 2022-05-24 NOTE — Plan of Care (Signed)
  Problem: Clinical Measurements: Goal: Respiratory complications will improve Outcome: Progressing   Problem: Bowel/Gastric: Goal: Will show no signs and symptoms of gastrointestinal bleeding Outcome: Not Progressing

## 2022-05-24 NOTE — Significant Event (Addendum)
Patient's nurse notified me that patient had frank rectal bleeding about 350 mL.  Blood pressure is 130/70 heart rate is around 106 bpm.  As per GI recommendation we will get a stat CT angiogram to locate for bleeding source.  I am also ordering 2 units of PRBC transfusion stat.  We will discuss with GI.  At around 6:35 AM May 24, 2022 patient after coming back from CT scan of the abdomen started having chest pressure and blood pressure was elevated at around 170.  On exam at bedside patient is not in distress chest pressure resolved without any intervention.  EKG shows ST depression in V3 and V4.  We will check cardiac markers and chest x-ray.  CT angiogram of the abdomen results are still pending.  No further bleeding after the earlier episode.  Second unit of PRBC transfusion is pending.  Gean Birchwood

## 2022-05-24 NOTE — Progress Notes (Signed)
Patient had critical Hgb 6.3. MD Hal Hope notified. New order for blood given. Patient was notified of lab results and MD orders. Patient stated that her abd had been "rolling" and that it felt like she wanted to have a BM. Patient assisted to [facility] where patient proceeded to have watery bloody BM. MD then notified of bloody BM- her first this admission- and new orders placed for total of 2 units of blood and stat CT per GI note.

## 2022-05-24 NOTE — Progress Notes (Signed)
Called by RN that patient had another episode of rectal bleeding. Will order [facility] GI blood loss scan

## 2022-05-24 NOTE — Progress Notes (Signed)
Patient was returning from CT when she was heard yelling as she coming up the hall towards her room. Upon encountering patient, patient to be noted in obvious distress, HR sustaining at 160s, c/o 10/10 chest pressure. When questioned further patient states pressure began after receiving contrast for the CT scan and that it gradually got worse on her trip back to her room. Vitals checked, EKG performed. MD Hal Hope notified of change in status. New placed per MD, MD to come evaluate patient at bedside.

## 2022-05-24 NOTE — Progress Notes (Signed)
Discussed Ariel Bridges's CTA with Dr. Loletha Carrow this morning.  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  [place], MD [phone]

## 2022-05-24 NOTE — Progress Notes (Addendum)
[facility]  PROGRESS NOTE  Ariel Bridges WGN:[id number] DOB: 1961/06/09 DOA: 05/21/2022 PCP: Practice, [facility]   Brief HPI:   61 year old female with history of recent hospitalization for acute lower GI bleed, diverticulosis, essential hypertension, chronic alcohol abuse who was admitted to [facility] on 05/21/2022 with suspected acute lower GI bleed after presenting from home to [facility], ED complaining of maroon appearing stool. -Patient with recent hospitalization  from 05/13/2022 - 05/15/22 with acute lower gastrointestinal bleed associated with acute blood loss anemia, during which time underwent colonoscopy with Dr. Bryan Lemma of [facility] gastroenterology on 05/15/22, with this evaluation notable for nonbleeding 6 mm polyp in the transverse colon as well as nonbleeding 4 mm polyp in the sigmoid colon both of which were resected and removed.  Colonoscopy also notable for diverticulosis throughout the entire colon, without evidence of active bleed, while noting a single diverticulum in the sigmoid colon with some surrounding focal erythema, but in the absence of overt active bleed.  No EGD at that time.  In the ED hemoglobin was found to be 5.5.  Also found to have low sodium 119.  Patient also significant use of beer.   Subjective   Events noted from last night.  Patient had frank rectal bleeding TML at 4:30 AM.  Vitals are stable.  Stat CT angiogram was ordered to locate bleeding source.  Also 2 units PRBC ordered.  6:30 AM patient came back from CTA abdominal chest pressure, blood pressure was elevated at 170.  EKG showed ST depression in V3 and V4.  Chest pain has resolved at this time.  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  We will consult vascular surgery -Was started on Protonix gtt. which has been discontinued per GI -GI recommends to obtain stat RBC tagged scan if she starts to bleed again. -Continue clear liquid diet  Chest pain -Likely from hypertensive urgency as well as demand ischemia from active GI bleed -Troponin x2 obtained; 9, 23 -Chest pain has resolved at this time -EKG showed ST depression in leads V3 and V4 -We will continue to monitor. -We will likely need cardiac evaluation for coronary disease when stable from GI perspective  Hyponatremia -Resolved -Likely from beer potomania -Continue LR at 50 mill per hour  Hypokalemia -Potassium 3.3 -Replace potassium and follow BMP in am  Alcohol abuse -Continue with CIWA protocol -Continue thiamine folate -No signs and symptoms of alcohol withdrawal  Hypertension -Home medication on hold due to soft BP -Continue as needed hydralazine   Medications     feeding supplement  1 Container Oral TID BM   folic acid  1 mg Oral Daily   multivitamin with minerals  1 tablet Oral Daily   thiamine  100 mg Oral Daily     Data Reviewed:   CBG:  No results for input(s): "GLUCAP" in the last 168 hours.  SpO2: 99 % O2 Flow Rate (L/min): 0 L/min    Vitals:   05/24/22 0744 05/24/22 1124 05/24/22 1225 05/24/22 1253  BP: (!) 142/91 119/85 138/88 121/90  Pulse: 95 87 85 85  Resp: '17 16 13 18  '$ Temp: 98 F (36.7 C) 98.3 F (36.8 C) 98.1 F (36.7 C) 97.8 F (36.6 C)  TempSrc: Oral Oral Oral Oral  SpO2: 100% 98% 96% 99%  Weight:      Height:          Data Reviewed:  Basic Metabolic Panel: Recent Labs  Lab 05/21/22 2314 05/22/22 1034 05/22/22 1555 05/23/22 0345 05/24/22 0244  NA 119*  131* 132* 132* 134*  K 3.6 3.9  --  3.7 3.3*  CL 87* 99  --  104 105  CO2 20* 22  --  22 22  GLUCOSE 127* 91  --  89 98  BUN 14 11  --  7* 10  CREATININE 0.83 0.82  --  0.82 0.73  CALCIUM 8.2* 8.6*  --  8.3* 7.8*  MG  --   --  1.6*  --   --     CBC: Recent Labs  Lab 05/21/22 2314 05/22/22 1034 05/22/22 1443 05/22/22 1555 05/23/22 0345 05/23/22 0731 05/23/22 1736 05/24/22 0244  WBC 7.9 6.5  --  8.1 6.3 7.9  --  7.8  NEUTROABS 5.9 5.1  --   --   --   --   --   --   HGB 5.5* 8.4*   < > 8.2* 7.5* 8.1* 7.7* 6.3*  HCT 15.6* 23.2*   < > 22.5* 21.2* 23.1* 22.7* 18.6*  MCV 96.3 86.6  --  85.6 86.5 88.2  --  91.6  PLT 301 289  --  290 268 302  --  278   < > = values in this interval not displayed.    LFT Recent Labs  Lab 05/22/22 1034  AST 23  ALT 14  ALKPHOS 63  BILITOT 0.6  PROT 5.0*  ALBUMIN 2.8*     Antibiotics: Anti-infectives (From admission, onward)    Start     Dose/Rate Route Frequency Ordered Stop   05/24/22 1145  ciprofloxacin (CIPRO) IVPB 400 mg        400 mg 200 mL/hr over 60 Minutes Intravenous Every 12 hours 05/24/22 1048     05/24/22 1145  metroNIDAZOLE (FLAGYL) IVPB 500 mg        500 mg 100 mL/hr over 60 Minutes Intravenous Every 12 hours 05/24/22 1048          DVT prophylaxis: SCDs  Code Status: Full code  Family Communication: No family at bedside   CONSULTS gastroenterology   Objective    Physical Examination:   General-appears in no acute distress Heart-S1-S2, regular, no murmur auscultated Lungs-clear to auscultation bilaterally, no wheezing or crackles auscultated Abdomen-soft, nontender, no organomegaly Extremities-no edema in the lower extremities Neuro-alert, oriented x3, no focal deficit noted  Status is: Inpatient: GI bleed       [place]   [facility] If 7PM-7AM, please contact night-coverage at [email or web address], Office  [phone]   05/24/2022, 1:40 PM  LOS: 2 days

## 2022-05-24 NOTE — Progress Notes (Signed)
[facility] Progress Note  Chief Complaint: Diverticular bleeding  History:  She had an episode of bright red blood per rectum about 430 this morning.  Stat CT angiogram obtained, but no active bleeding seen.  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  Upon returning from CT scan several hours ago, she had a brief episode of severe chest pressure as if someone was standing on her.  EKG showed ischemic changes, her chest pain then resolved.  Hemoglobin had dropped to 6.3, and she has been transfused 1 unit of PRBCs.  Repeat hemoglobin was obtained short while ago with results still pending  She is presently on a clear liquid diet with liquid protein calorie drinks. Denies abdominal pain at any point during this hospital stay.  Objective:   Current Facility-Administered Medications:    0.9 %  sodium chloride infusion (Manually program via Guardrails IV Fluids), , Intravenous, Once, Rise Patience, MD   acetaminophen (TYLENOL) tablet 650 mg, 650 mg, Oral, Q6H PRN **OR** acetaminophen (TYLENOL) suppository 650 mg, 650 mg, Rectal, Q6H PRN, Howerter, Justin B, DO   feeding supplement (BOOST / RESOURCE BREEZE) liquid 1 Container, 1 Container, Oral, TID BM, Nelida Meuse III, MD, 1 Container at 29/79/89 2119   folic acid (FOLVITE) tablet 1 mg, 1 mg, Oral, Daily, Howerter, Justin B, DO, 1 mg at 05/23/22 1033   hydrALAZINE (APRESOLINE) injection 5 mg, 5 mg, Intravenous, Q6H PRN, Elgergawy, Silver Huguenin, MD   lactated ringers infusion, , Intravenous, Continuous, Howerter, Justin B, DO, Last Rate: 50 mL/hr at 05/24/22 0446, Infusion Verify at 05/24/22 0446   LORazepam (ATIVAN) tablet 1-4 mg, 1-4 mg, Oral, Q1H PRN, 1 mg at 05/22/22 2103 **OR** LORazepam (ATIVAN) injection 1-4 mg, 1-4 mg, Intravenous, Q1H PRN, Howerter, Justin B, DO   multivitamin with minerals tablet 1 tablet, 1 tablet, Oral,  Daily, Howerter, Justin B, DO, 1 tablet at 05/23/22 1033   nicotine (NICODERM CQ - dosed in mg/24 hours) patch 14 mg, 14 mg, Transdermal, Daily PRN, Howerter, Justin B, DO   thiamine tablet 100 mg, 100 mg, Oral, Daily, Darrick Meigs, Marge Duncans, MD   lactated ringers 50 mL/hr at 05/24/22 0446     Vital signs in last 24 hrs: Vitals:   05/24/22 0645 05/24/22 0744  BP: (!) 143/94 (!) 142/91  Pulse:  95  Resp: 14 17  Temp:  98 F (36.7 C)  SpO2: 100% 100%    Intake/Output Summary (Last 24 hours) at 05/24/2022 1031 Last data filed at 05/24/2022 0743 Gross per 24 hour  Intake 3499.8 ml  Output 2550 ml  Net 949.8 ml     Physical Exam Worried and sad affect, tearful at times HEENT: sclera anicteric, oral mucosa without lesions Neck: supple, no thyromegaly, JVD or lymphadenopathy Cardiac: RRR without murmurs, S1S2 heard, no peripheral edema Pulm: clear to auscultation bilaterally, normal RR and effort noted Abdomen: soft, no tenderness, with active bowel sounds. No guarding or palpable hepatosplenomegaly Skin; warm and dry,  + pale  Recent Labs:     Latest Ref Rng & Units 05/24/2022    2:44 AM 05/23/2022    5:36 PM 05/23/2022    7:31 AM  CBC  WBC 4.0 - 10.5 K/uL 7.8   7.9   Hemoglobin 12.0 - 15.0 g/dL 6.3  7.7  8.1   Hematocrit 36.0 - 46.0 % 18.6  22.7  23.1   Platelets 150 - 400 K/uL 278  302     Recent Labs  Lab 05/22/22 1555  INR 1.1      Latest Ref Rng & Units 05/24/2022    2:44 AM 05/23/2022    3:45 AM 05/22/2022    3:55 PM  CMP  Glucose 70 - 99 mg/dL 98  89    BUN 8 - 23 mg/dL 10  7    Creatinine 0.44 - 1.00 mg/dL 0.73  0.82    Sodium 135 - 145 mmol/L 134  132  132   Potassium 3.5 - 5.1 mmol/L 3.3  3.7    Chloride 98 - 111 mmol/L 105  104    CO2 22 - 32 mmol/L 22  22    Calcium 8.9 - 10.3 mg/dL 7.8  8.3       Radiologic studies: CLINICAL DATA:  Lower GI bleed   EXAM: CTA ABDOMEN AND PELVIS WITHOUT AND WITH CONTRAST   TECHNIQUE: Multidetector CT imaging of the  abdomen and pelvis was performed using the standard protocol during bolus administration of intravenous contrast. Multiplanar reconstructed images and MIPs were obtained and reviewed to evaluate the vascular anatomy.   RADIATION DOSE REDUCTION: This exam was performed according to the departmental dose-optimization program which includes automated exposure control, adjustment of the mA and/or kV according to patient size and/or use of iterative reconstruction technique.   CONTRAST:  151m OMNIPAQUE IOHEXOL 350 MG/ML SOLN   COMPARISON:  None available   FINDINGS: VASCULAR   Aorta: Calcified and noncalcified atheromatous plaque seen throughout the abdominal aorta without aneurysmal dilatation or flow-limiting stenosis. A small displaced partially calcified linear structure is seen within the lumen of the distal infrarenal abdominal aorta, best seen on image 58 of series 6. It is suspicious for non flow limiting focal dissection.   Celiac: Mild stenosis of the origin of the celiac artery.   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   Renals: Complete to near complete occlusion at the origin of the renal arteries bilaterally.   IMA: Occluded at the origin. Opacification of distal branches indicative of retrograde collateral flow.   Inflow: Mild to moderate diffuse stenosis of the left common iliac artery. Near complete occlusion at the origin of the left internal iliac artery. Moderate to severe multifocal stenosis of the distal left external iliac artery. Severe stenosis of the right common iliac artery. Near complete occlusion at the origin of the right internal iliac artery. Complete to near complete occlusion of the distal right external iliac artery.   Proximal Outflow: Moderate stenosis of the right common femoral artery. Moderate stenosis of the left common femoral artery. Mild diffuse stenosis of the visualized proximal left superficial  femoral artery.   Veins: Hepatic, portal, superior mesenteric veins are patent.   Review of the MIP images confirms the above findings.   NON-VASCULAR   Lower chest: 4 mm left lower lobe pulmonary nodule (image 1, series 6). Visualized lung bases otherwise unremarkable.   Hepatobiliary: No focal liver abnormality is seen. No gallstones, gallbladder wall thickening, or biliary dilatation.   Pancreas: Unremarkable. No pancreatic ductal dilatation or surrounding inflammatory changes.   Spleen: Normal in size without focal abnormality.   Adrenals/Urinary Tract: Adrenal glands are normal.   Advanced atrophy of the right kidney. Irregular thinning of the cortex of the left kidney likely due to prior ischemic or infectious etiology. Kidneys, ureters, and bladder otherwise unremarkable.   Stomach/Bowel: No active hemorrhage identified within the gastrointestinal tract. There is diffuse colonic diverticulosis. There is  fat stranding adjacent to the proximal descending colon suspicious for acute diverticulitis. No evidence of perforation or abscess formation. Appendix is normal.   Lymphatic: No enlarged abdominal or pelvic lymph nodes.   Reproductive: Uterus and bilateral adnexa are unremarkable.   Other: No abdominal wall hernia or abnormality. No abdominopelvic ascites.   Musculoskeletal: Deformity of the distal sacrum suspicious for prior healed fracture. This is best seen on image 88 of series 16. No acute osseous abnormality.   IMPRESSION: VASCULAR   1. No active gastrointestinal hemorrhage. 2. 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 3. 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 4. 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 5. 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   NON-VASCULAR   1. Diffuse colonic diverticulosis with  stranding of the fat adjacent to the proximal descending colon which is suspicious for acute diverticulitis. No evidence of perforation or abscess formation. 2. 4 mm left lower lobe pulmonary nodule (1/6). No follow-up needed if patient is low-risk.This recommendation follows the consensus statement: Guidelines for Management of Incidental Pulmonary Nodules Detected on CT Images: From the Fleischner Society 2017; Radiology 2017; 284:228-243.   These results will be called to the ordering clinician or representative by the Radiologist Assistant, and communication documented in the PACS or [employer].     Electronically Signed   By: Miachel Roux M.D.   On: 05/24/2022 08:38  (No prior CT abdomen for comparison - HD)  Assessment & Plan  Assessment: Recurrent diverticular bleeding causing hematochezia and acute blood loss anemia. Today's episode caused demand cardiac ischemia  CT angiogram also shows severe diffuse abdominal pelvic atherosclerotic disease which might preclude IR intervention for this bleed should be localized on any imaging study.  She is not reporting abdominal pain at present, so it is not clear if the CT findings of pericolonic fat stranding near the descending are truly diverticulitis.  It is also uncommon for diverticulitis to cause bleeding.  Nevertheless, I have opted to put her on ciprofloxacin and metronidazole in case there is low-grade diverticulitis contributing to this problem.  I will message the hospital physician but I think she would benefit from another unit of PRBCs.  If she should bleed again, I would give her a higher hemoglobin cushion to hopefully prevent recurrent cardiac ischemia.  With next leading episode, she needs to go for a tagged RBC scan.  CT scan was unable to localize it, and I am concerned about more IV contrast potentially affecting her kidneys given the severe bilateral renal artery stenosis.  I will also message the IR  physician who read her today's CT angiogram so they can comment on whether or not abdominal angiogram would even be technically feasible as an option to localize any identified bleeding source.  If not, then we would need a surgical consultation for ongoing bleeding. Colonoscopy last week found a left sided diverticular opening with some nonspecific surrounding erythema but no active bleeding. If persistent bleeding not found on another imaging study, she may need an urgent rapid prep or unprepped colonoscopy for last effort to localize the bleeding.   40 minutes were spent on this encounter (including chart review, history/exam, counseling/coordination of care, and documentation) > 50% of that time was spent on counseling and coordination of care.   Nelida Meuse III Office: [phone]

## 2022-05-24 NOTE — Progress Notes (Signed)
Patient had another bright red bloody stool. MD notified.  Post blood H & H drawn just before episode and has not resulted as of yet. Patient resting in bed, call bell within reach.  HR 85 BP 139/95

## 2022-05-24 NOTE — Consult Note (Signed)
[facility] [place]  ASSESSMENT / PLAN: Ariel Bridges is a 61 y.o. 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  Patient counseled patients with asymptomatic peripheral arterial disease or claudication have a 1-2% risk of developing chronic limb threatening ischemia, but a 15-30% risk of mortality in the next 5 years. Intervention should only be considered for medically optimized patients with disabling symptoms.   Recommend the following which can slow the progression of atherosclerosis and reduce the risk of major adverse cardiac / limb events:  Complete cessation from all tobacco products. Blood glucose control with goal A1c < 7%. Blood pressure control with goal blood pressure < 140/90 mmHg. Lipid reduction therapy with goal LDL-C <70 Aspirin '81mg'$  PO QD.  Atorvastatin 40-'80mg'$  PO QD (or other "high intensity" statin therapy). Daily walking to and past the point of discomfort. Patient counseled to keep a log of exercise distance. Adequate hydration (at least 2 liters / day) if patient's heart and kidney function is adequate.  Patient counseled that we should avoid intervention if at all possible until she is medically optimized.  She does report rather severe symptoms which are verging on disabling, but these may be exacerbated by anemia.  I will plan to see her in the clinic in 1 to 2 months.  Please call for any questions  CHIEF COMPLAINT: Incidental discovery of arterial disease on CT angiogram  HISTORY OF PRESENT ILLNESS: Ariel Bridges is a 61 y.o. female admitted to the internal medicine service for lower gastrointestinal bleeding.  She has undergone work-up including endoscopy and CT angiogram for this.  She continues to suffer bleeding per rectum.  No identifiable source was seen on CT angiogram today, but significant aortoiliac occlusive disease was noted.  On my interview, the patient reports antecedent severe  claudication symptoms which are bordering on disabling.  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  She notices some relief with positional changes, raising the question of spinal stenosis or pseudoclaudication.  She does report a fairly classic symptoms of cramping calf discomfort with walking however.  She does not have ischemic rest pain.  She does endorse some discomfort in her feet, but this is not relieved by dependency.  She has no ulcers about her feet.  VASCULAR SURGICAL HISTORY: none  VASCULAR RISK FACTORS: Negative history of stroke / transient ischemic attack. Negative history of coronary artery disease.  Negative history of diabetes mellitus.  Positive history of smoking. + actively smoking. Positive history of hypertension.  Negative history of chronic kidney disease.   Negative history of chronic obstructive pulmonary disease.  FUNCTIONAL STATUS: ECOG performance status: (0) Fully active, able to carry on all predisease performance without restriction Ambulatory status: Ambulatory within the community with limits  Past Medical History:  Diagnosis Date   Alcohol abuse    Colon polyps    Diverticulosis    Hypertension    Lower GI bleed    Smoker     Past Surgical History:  Procedure Laterality Date   CESAREAN SECTION     COLONOSCOPY WITH PROPOFOL N/A 05/15/2022   Procedure: COLONOSCOPY WITH PROPOFOL;  Surgeon: Lavena Bullion, DO;  Location: [facility];  Service: Gastroenterology;  Laterality: N/A;   POLYPECTOMY  05/15/2022   Procedure: POLYPECTOMY;  Surgeon: Lavena Bullion, DO;  Location: [facility];  Service: Gastroenterology;;    History reviewed. No pertinent family history.  Social History   Socioeconomic History   Marital status: Divorced  Spouse name: Not on file   Number of children: Not on file   Years of education: Not on file   Highest education level: Not on file  Occupational History   Not on file  Tobacco Use   Smoking status: Every  Day    Types: Cigarettes    Passive exposure: Current   Smokeless tobacco: Never  Vaping Use   Vaping Use: Not on file  Substance and Sexual Activity   Alcohol use: Yes    Comment: daily   Drug use: Never   Sexual activity: Not Currently  Other Topics Concern   Not on file  Social History Narrative   Not on file   Social Determinants of Health   Financial Resource Strain: Not on file  Food Insecurity: Not on file  Transportation Needs: Not on file  Physical Activity: Not on file  Stress: Not on file  Social Connections: Not on file  Intimate Partner Violence: Not on file    Allergies  Allergen Reactions   Latex Itching and Other (See Comments)    redness    Current Facility-Administered Medications  Medication Dose Route Frequency Provider Last Rate Last Admin   acetaminophen (TYLENOL) tablet 650 mg  650 mg Oral Q6H PRN Howerter, Justin B, DO   650 mg at 05/24/22 1300   Or   acetaminophen (TYLENOL) suppository 650 mg  650 mg Rectal Q6H PRN Howerter, Justin B, DO       ciprofloxacin (CIPRO) IVPB 400 mg  400 mg Intravenous Q12H Nelida Meuse III, MD   Stopped at 05/24/22 1419   feeding supplement (BOOST / RESOURCE BREEZE) liquid 1 Container  1 Container Oral TID BM Nelida Meuse III, MD   1 Container at 96/22/29 7989   folic acid (FOLVITE) tablet 1 mg  1 mg Oral Daily Howerter, Justin B, DO   1 mg at 05/24/22 1154   hydrALAZINE (APRESOLINE) injection 5 mg  5 mg Intravenous Q6H PRN Elgergawy, Silver Huguenin, MD       lactated ringers infusion   Intravenous Continuous Howerter, Justin B, DO 50 mL/hr at 05/24/22 1919 New Bag at 05/24/22 1919   LORazepam (ATIVAN) tablet 1-4 mg  1-4 mg Oral Q1H PRN Howerter, Justin B, DO   1 mg at 05/22/22 2103   Or   LORazepam (ATIVAN) injection 1-4 mg  1-4 mg Intravenous Q1H PRN Howerter, Justin B, DO       metroNIDAZOLE (FLAGYL) IVPB 500 mg  500 mg Intravenous Q12H Nelida Meuse III, MD   Stopped at 05/24/22 1418   multivitamin with minerals  tablet 1 tablet  1 tablet Oral Daily Howerter, Justin B, DO   1 tablet at 05/24/22 1154   nicotine (NICODERM CQ - dosed in mg/24 hours) patch 14 mg  14 mg Transdermal Daily PRN Howerter, Justin B, DO       thiamine tablet 100 mg  100 mg Oral Daily Oswald Hillock, MD   100 mg at 05/24/22 1154    PHYSICAL EXAM Vitals:   05/24/22 1253 05/24/22 1535 05/24/22 1817 05/24/22 1922  BP: 121/90 (!) 139/93 (!) 139/95 124/85  Pulse: 85 75 89   Resp: '18 13 17 16  '$ Temp: 97.8 F (36.6 C) 97.6 F (36.4 C) 98 F (36.7 C) 97.7 F (36.5 C)  TempSrc: Oral Oral Oral Oral  SpO2: 99% 99% 100% 99%  Weight:      Height:        Constitutional: Chronically ill-appearing woman  in no acute distress Cardiac: Regular rate and rhythm.  Respiratory: unlabored. Abdominal:  non-distended.  Peripheral vascular: 1+ femoral pulses bilaterally.  No palpable pedal pulses bilaterally.  PERTINENT LABORATORY AND RADIOLOGIC DATA  Most recent CBC    Latest Ref Rng & Units 05/24/2022    5:53 PM 05/24/2022    2:44 AM 05/23/2022    5:36 PM  CBC  WBC 4.0 - 10.5 K/uL  7.8    Hemoglobin 12.0 - 15.0 g/dL 8.3  6.3  7.7   Hematocrit 36.0 - 46.0 % 24.1  18.6  22.7   Platelets 150 - 400 K/uL  278       Most recent CMP    Latest Ref Rng & Units 05/24/2022    9:20 AM 05/24/2022    2:44 AM 05/23/2022    3:45 AM  CMP  Glucose 70 - 99 mg/dL 94  98  89   BUN 8 - 23 mg/dL '10  10  7   '$ Creatinine 0.44 - 1.00 mg/dL 0.74  0.73  0.82   Sodium 135 - 145 mmol/L 137  134  132   Potassium 3.5 - 5.1 mmol/L 4.0  3.3  3.7   Chloride 98 - 111 mmol/L 104  105  104   CO2 22 - 32 mmol/L '21  22  22   '$ Calcium 8.9 - 10.3 mg/dL 8.1  7.8  8.3     Renal function Estimated Creatinine Clearance: 63.2 mL/min (by C-G formula based on SCr of 0.74 mg/dL).  Hgb A1c MFr Bld (%)  Date Value  05/13/2022 6.0 (H)   CT angiogram reviewed in detail.  Significant aortoiliac occlusive disease causing significant stenosis of the iliac vessels  bilaterally.  Yevonne Aline. Stanford Breed, MD [facility] Phone Number: [phone] 05/24/2022 7:45 PM  Total time spent on preparing this encounter including chart review, data review, collecting history, examining the patient, coordinating care for this new patient, 60 minutes.  Portions of this report may have been transcribed using voice recognition software.  Every effort has been made to ensure accuracy; however, inadvertent computerized transcription errors may still be present.

## 2022-05-25 ENCOUNTER — Inpatient Hospital Stay (HOSPITAL_COMMUNITY): Payer: 59

## 2022-05-25 DIAGNOSIS — I739 Peripheral vascular disease, unspecified: Secondary | ICD-10-CM

## 2022-05-25 LAB — CBC
HCT: 23.6 % — ABNORMAL LOW (ref 36.0–46.0)
Hemoglobin: 8.5 g/dL — ABNORMAL LOW (ref 12.0–15.0)
MCH: 30.1 pg (ref 26.0–34.0)
MCHC: 36 g/dL (ref 30.0–36.0)
MCV: 83.7 fL (ref 80.0–100.0)
Platelets: 249 10*3/uL (ref 150–400)
RBC: 2.82 MIL/uL — ABNORMAL LOW (ref 3.87–5.11)
RDW: 18.9 % — ABNORMAL HIGH (ref 11.5–15.5)
WBC: 8.6 10*3/uL (ref 4.0–10.5)
nRBC: 0 % (ref 0.0–0.2)

## 2022-05-25 LAB — MAGNESIUM: Magnesium: 1.5 mg/dL — ABNORMAL LOW (ref 1.7–2.4)

## 2022-05-25 LAB — TYPE AND SCREEN
ABO/RH(D): A POS
ABO/RH(D): A POS
Antibody Screen: NEGATIVE
Antibody Screen: NEGATIVE
Unit division: 0
Unit division: 0
Unit division: 0
Unit division: 0
Unit division: 0

## 2022-05-25 LAB — BPAM RBC
Blood Product Expiration Date: 202307272359
Blood Product Expiration Date: 202307282359
Blood Product Expiration Date: 202307292359
Blood Product Expiration Date: 202308022359
Blood Product Expiration Date: 202308102359
ISSUE DATE / TIME: 202307180056
ISSUE DATE / TIME: 202307180056
ISSUE DATE / TIME: 202307200425
ISSUE DATE / TIME: 202307201233
ISSUE DATE / TIME: 202307210741
Unit Type and Rh: 6200
Unit Type and Rh: 6200
Unit Type and Rh: 6200
Unit Type and Rh: 6200
Unit Type and Rh: 6200

## 2022-05-25 LAB — BASIC METABOLIC PANEL
Anion gap: 11 (ref 5–15)
BUN: 14 mg/dL (ref 8–23)
CO2: 21 mmol/L — ABNORMAL LOW (ref 22–32)
Calcium: 8.4 mg/dL — ABNORMAL LOW (ref 8.9–10.3)
Chloride: 102 mmol/L (ref 98–111)
Creatinine, Ser: 0.8 mg/dL (ref 0.44–1.00)
GFR, Estimated: >60 mL/min (ref >60)
Glucose, Bld: 89 mg/dL (ref 70–99)
Potassium: 4 mmol/L (ref 3.5–5.1)
Sodium: 134 mmol/L — ABNORMAL LOW (ref 135–145)

## 2022-05-25 LAB — HEMOGLOBIN AND HEMATOCRIT, BLOOD
HCT: 25 % — ABNORMAL LOW (ref 36.0–46.0)
HCT: 28.8 % — ABNORMAL LOW (ref 36.0–46.0)
Hemoglobin: 8.4 g/dL — ABNORMAL LOW (ref 12.0–15.0)
Hemoglobin: 9.7 g/dL — ABNORMAL LOW (ref 12.0–15.0)

## 2022-05-25 MED ORDER — ORAL CARE MOUTH RINSE: 15.0000 mL | OROMUCOSAL | Status: DC | PRN

## 2022-05-25 MED ORDER — PEG-KCL-NACL-NASULF-NA ASC-C 100 G PO SOLR
1.0000 | Freq: Once | ORAL | Status: DC
Start: 2022-05-25 — End: 2022-05-25

## 2022-05-25 MED ORDER — MAGNESIUM SULFATE 2 GM/50ML IV SOLN
2.0000 g | Freq: Once | INTRAVENOUS | Status: AC
Start: 2022-05-25 — End: 2022-05-25
  Administered 2022-05-25: 2 g via INTRAVENOUS
  Filled 2022-05-25: qty 50

## 2022-05-25 MED ORDER — PEG-KCL-NACL-NASULF-NA ASC-C 100 G PO SOLR
0.5000 | Freq: Once | ORAL | Status: AC
  Administered 2022-05-25: 100 g via ORAL
  Filled 2022-05-25: qty 1

## 2022-05-25 MED ORDER — ZOLPIDEM TARTRATE 5 MG PO TABS
5.0000 mg | ORAL_TABLET | Freq: Once | ORAL | Status: AC
  Administered 2022-05-25: 5 mg via ORAL
  Filled 2022-05-25: qty 1

## 2022-05-25 MED ORDER — TECHNETIUM TC 99M-LABELED RED BLOOD CELLS IV KIT
20.0000 | PACK | Freq: Once | INTRAVENOUS | Status: AC | PRN
  Administered 2022-05-25: 20 via INTRAVENOUS

## 2022-05-25 NOTE — Progress Notes (Signed)
Completed the 1L of moviprep, tolerated well.

## 2022-05-25 NOTE — Progress Notes (Signed)
Transported to nuclear med by bed awake and alert.

## 2022-05-25 NOTE — Progress Notes (Signed)
Transported in from vascular lab.  By bed awake and alert. Right ac iv site reddened and swollen , iv cath removed and cold compress applied. Continue to monitor.

## 2022-05-25 NOTE — Progress Notes (Signed)
ABI/TBI study completed. Please see CV Proc for preliminary results.  Hien Cunliffe BS, RVT 05/25/2022 12:56 PM

## 2022-05-25 NOTE — Progress Notes (Addendum)
Patient ID: Ariel Bridges, female   DOB: 09/07/1961, 61 y.o.   MRN: [id number]    Progress Note   Subjective   Day # 4  CC; GI bleeding,  Nuc med bleeding scan-ordered last p.m., done this morning around 9 AM-no evidence of active bleeding  Hemoglobin stable today at 8.5, post transfusions yesterday  CTA yesterday, no active extravasation, severe diffuse abdominal atherosclerotic disease, diffuse diverticulosis.  Patient says she feels okay, denies any abdominal discomfort.  She had an episode of bloody stool x1 early last evening, she has not had any further stools since    Objective   Vital signs in last 24 hours: Temp:  [97.6 F (36.4 C)-98.7 F (37.1 C)] 98.7 F (37.1 C) (07/21 0337) Pulse Rate:  [75-89] 84 (07/21 0749) Resp:  [11-18] 14 (07/21 0749) BP: (105-139)/(71-95) 105/71 (07/21 0749) SpO2:  [95 %-100 %] 99 % (07/21 0749) Weight:  [55.7 kg] 55.7 kg (07/21 0337) Last BM Date : 05/24/22 General:    Older white female in NAD Heart:  Regular rate and rhythm; no murmurs Lungs: Respirations even and unlabored, lungs CTA bilaterally Abdomen:  Soft, nontender and nondistended. Normal bowel sounds. Extremities:  Without edema. Neurologic:  Alert and oriented,  grossly normal neurologically. Psych:  Cooperative. Normal mood and affect.  Intake/Output from previous day: 07/20 0701 - 07/21 0700 In: 3325.3 [P.O.:1120; I.V.:1185.8; Blood:368.8; IV Piggyback:650.7] Out: 3250 [Urine:3250] Intake/Output this shift: Total I/O In: 75 [I.V.:62.5; IV Piggyback:12.6] Out: -   Lab Results: Recent Labs    05/23/22 0731 05/23/22 1736 05/24/22 0244 05/24/22 1753 05/25/22 0335  WBC 7.9  --  7.8  --  8.6  HGB 8.1*   < > 6.3* 8.3* 8.5*  HCT 23.1*   < > 18.6* 24.1* 23.6*  PLT 302  --  278  --  249   < > = values in this interval not displayed.   BMET Recent Labs    05/24/22 0244 05/24/22 0920 05/25/22 0335  NA 134* 137 134*  K 3.3* 4.0 4.0  CL 105 104 102  CO2 22  21* 21*  GLUCOSE 98 94 89  BUN '10 10 14  '$ CREATININE 0.73 0.74 0.80  CALCIUM 7.8* 8.1* 8.4*   LFT No results for input(s): "PROT", "ALBUMIN", "AST", "ALT", "ALKPHOS", "BILITOT", "BILIDIR", "IBILI" in the last 72 hours. PT/INR Recent Labs    05/22/22 1555  LABPROT 14.1  INR 1.1    Studies/Results: NM GI Blood Loss  Result Date: 05/25/2022 CLINICAL DATA:  GI bleeding EXAM: NUCLEAR MEDICINE GASTROINTESTINAL BLEEDING SCAN TECHNIQUE: Sequential abdominal images were obtained following intravenous administration of Tc-51mlabeled red blood cells. RADIOPHARMACEUTICALS:  20.0 MCi Tc-92mertechnetate in-vitro labeled red cells. COMPARISON:  None Available. FINDINGS: No evidence of radiotracer extravasation into the gastrointestinal tract to localize reported bleeding through 2 hours of planar imaging. Expected blood pool and urinary tract activity. IMPRESSION: No evidence of radiotracer extravasation into the gastrointestinal tract to localize reported bleeding through 2 hours of planar imaging. Electronically Signed   By: AlDelanna Ahmadi.D.   On: 05/25/2022 12:55   VAS [place] WITH/WO TBI  Result Date: 05/25/2022  LOWER EXTREMITY DOPPLER STUDY Patient Name:  Ariel RIVEREDate of Exam:   05/25/2022 Medical Rec #: [id number]       Accession #:    [id number] of Birth: 9/04-Sep-1961       Patient Gender: F Patient Age:   [age] Exam Location:  [facility] Procedure:      VAS [place] ABI WITH/WO TBI Referring Phys: Jamelle Haring --------------------------------------------------------------------------------  Indications: Claudication, and Significant aortoiliac disease. High Risk Factors: Hypertension, current smoker.  Comparison Study: No previous exam noted. Performing Technologist: Bobetta Lime BS RVT  Examination Guidelines: A complete evaluation includes at minimum, Doppler waveform signals and systolic blood pressure reading at the level of bilateral brachial, anterior tibial, and  posterior tibial arteries, when vessel segments are accessible. Bilateral testing is considered an integral part of a complete examination. Photoelectric Plethysmograph (PPG) waveforms and toe systolic pressure readings are included as required and additional duplex testing as needed. Limited examinations for reoccurring indications may be performed as noted.  ABI Findings: +---------+------------------+-----+----------+--------+ Right    Rt Pressure (mmHg)IndexWaveform  Comment  +---------+------------------+-----+----------+--------+ Brachial 204                    triphasic          +---------+------------------+-----+----------+--------+ PTA      97                0.48 monophasic         +---------+------------------+-----+----------+--------+ DP       99                0.49 monophasic         +---------+------------------+-----+----------+--------+ Great Toe                       Absent             +---------+------------------+-----+----------+--------+ +---------+------------------+-----+----------+--------+ Left     Lt Pressure (mmHg)IndexWaveform  Comment  +---------+------------------+-----+----------+--------+ Brachial 150                    biphasic  Abnormal +---------+------------------+-----+----------+--------+ PTA      123               0.60 monophasic         +---------+------------------+-----+----------+--------+ DP       102               0.50 monophasic         +---------+------------------+-----+----------+--------+ Great Toe                       Absent             +---------+------------------+-----+----------+--------+ +-------+-----------+-----------+------------+------------+ ABI/TBIToday's ABIToday's TBIPrevious ABIPrevious TBI +-------+-----------+-----------+------------+------------+ Right  0.49                                           +-------+-----------+-----------+------------+------------+ Left   0.60                                            +-------+-----------+-----------+------------+------------+  The left brachial arm pressure is much lower compared to the right and abnormal waveforms also noted on the left, which is indicative of probable proximal obstruction.  Summary: Right: Resting right ankle-brachial index indicates severe right lower extremity arterial disease. Absent great toe waveform. Left: Resting left ankle-brachial index indicates moderate left lower extremity arterial disease. Absent great toe waveform. *See table(s) above for measurements and observations.     Preliminary    DG CHEST PORT 1 VIEW  Result Date: 05/24/2022  CLINICAL DATA:  61 year old female with chest pain EXAM: PORTABLE CHEST 1 VIEW COMPARISON:  CTA 05/24/2022 FINDINGS: Prominent ascending aorta and tortuous descending aorta. Normal heart size. No focal consolidation, pleural effusion, or pneumothorax. Remote right clavicle fracture. No acute osseous abnormality. IMPRESSION: Prominent ascending aorta and tortuous descending aorta. If there is concern for thoracic aortic aneurysm or dissection, consider CTA of the chest for further evaluation. Electronically Signed   By: Placido Sou M.D.   On: 05/24/2022 09:13   CT ANGIO GI BLEED  Result Date: 05/24/2022 CLINICAL DATA:  Lower GI bleed EXAM: CTA ABDOMEN AND PELVIS WITHOUT AND WITH CONTRAST TECHNIQUE: Multidetector CT imaging of the abdomen and pelvis was performed using the standard protocol during bolus administration of intravenous contrast. Multiplanar reconstructed images and MIPs were obtained and reviewed to evaluate the vascular anatomy. RADIATION DOSE REDUCTION: This exam was performed according to the departmental dose-optimization program which includes automated exposure control, adjustment of the mA and/or kV according to patient size and/or use of iterative reconstruction technique. CONTRAST:  157m OMNIPAQUE IOHEXOL 350 MG/ML SOLN COMPARISON:  None  available FINDINGS: VASCULAR Aorta: Calcified and noncalcified atheromatous plaque seen throughout the abdominal aorta without aneurysmal dilatation or flow-limiting stenosis. A small displaced partially calcified linear structure is seen within the lumen of the distal infrarenal abdominal aorta, best seen on image 58 of series 6. It is suspicious for non flow limiting focal dissection. Celiac: Mild stenosis of the origin of the celiac artery. 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 Renals: Complete to near complete occlusion at the origin of the renal arteries bilaterally. IMA: Occluded at the origin. Opacification of distal branches indicative of retrograde collateral flow. Inflow: Mild to moderate diffuse stenosis of the left common iliac artery. Near complete occlusion at the origin of the left internal iliac artery. Moderate to severe multifocal stenosis of the distal left external iliac artery. Severe stenosis of the right common iliac artery. Near complete occlusion at the origin of the right internal iliac artery. Complete to near complete occlusion of the distal right external iliac artery. Proximal Outflow: Moderate stenosis of the right common femoral artery. Moderate stenosis of the left common femoral artery. Mild diffuse stenosis of the visualized proximal left superficial femoral artery. Veins: Hepatic, portal, superior mesenteric veins are patent. Review of the MIP images confirms the above findings. NON-VASCULAR Lower chest: 4 mm left lower lobe pulmonary nodule (image 1, series 6). Visualized lung bases otherwise unremarkable. Hepatobiliary: No focal liver abnormality is seen. No gallstones, gallbladder wall thickening, or biliary dilatation. Pancreas: Unremarkable. No pancreatic ductal dilatation or surrounding inflammatory changes. Spleen: Normal in size without focal abnormality. Adrenals/Urinary Tract: Adrenal glands are normal. Advanced atrophy of the right kidney.  Irregular thinning of the cortex of the left kidney likely due to prior ischemic or infectious etiology. Kidneys, ureters, and bladder otherwise unremarkable. Stomach/Bowel: No active hemorrhage identified within the gastrointestinal tract. There is diffuse colonic diverticulosis. There is fat stranding adjacent to the proximal descending colon suspicious for acute diverticulitis. No evidence of perforation or abscess formation. Appendix is normal. Lymphatic: No enlarged abdominal or pelvic lymph nodes. Reproductive: Uterus and bilateral adnexa are unremarkable. Other: No abdominal wall hernia or abnormality. No abdominopelvic ascites. Musculoskeletal: Deformity of the distal sacrum suspicious for prior healed fracture. This is best seen on image 88 of series 16. No acute osseous abnormality. IMPRESSION: VASCULAR 1. No active gastrointestinal hemorrhage. 2. 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 3.  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 4. 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 5. 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 NON-VASCULAR 1. Diffuse colonic diverticulosis with stranding of the fat adjacent to the proximal descending colon which is suspicious for acute diverticulitis. No evidence of perforation or abscess formation. 2. 4 mm left lower lobe pulmonary nodule (1/6). No follow-up needed if patient is low-risk.This recommendation follows the consensus statement: Guidelines for Management of Incidental Pulmonary Nodules Detected on CT Images: From the Fleischner Society 2017; Radiology 2017; 284:228-243. These results will be called to the ordering clinician or representative by the Radiologist Assistant, and communication documented in the PACS or [employer]. Electronically Signed   By: Miachel Roux M.D.   On: 05/24/2022 08:38       Assessment / Plan:     #58  61 year old white female admitted 3days ago with rectal bleeding felt to be consistent with recurrent diverticular bleeding.  She had undergone colonoscopy on 05/15/2022 for hematochezia and hemoglobin 8.5.  At that time she was noted to have a 6 mm polyp in the transverse colon and a 4 mm polyp in the sigmoid both removed with cold snare, there was focal erythema surrounding a solitary sigmoid diverticulum suspicious for recent diverticulitis, and pandiverticulosis.  CTA yesterday-no active bleeding/extravasation, severe mesenteric vascular disease noted  Patient had another episode of bloody stool last night x1-nuc med bleeding scan was ordered, however this was not completed until this morning about 9 AM had not had any further bleeding in the interim Study was negative for active bleed.  She required transfusions yesterday x2, hemoglobin has been stable since.  Etiology of her bleeding likely either secondary to stuttering diverticular bleed,, less likely but possible post polypectomy ulcer from polypectomies on 05/15/2022    #2 anemia secondary to GI blood loss #3 hypertension #4 EtOH abuse  Plan; advance to full liquid diet Continue to trend hemoglobin every 8 hours, transfuse to keep hemoglobin 8-patient had an episode of chest pain this admission felt secondary to demand ischemia with bleeding  Consider repeat colonoscopy GI will continue to follow      Principal Problem:   Acute lower GI bleeding Active Problems:   Tobacco abuse   Alcohol abuse   Essential hypertension   Acute blood loss anemia   Acute hyponatremia     LOS: 3 days   Amy Esterwood PA-C 05/25/2022, 1:27 PM  I have taken an interval history, thoroughly reviewed the chart and examined the patient. I agree with the Advanced Practitioner's note, impression and recommendations, and have recorded additional findings, impressions and recommendations below. I performed a substantive portion of this  encounter (>50% time spent), including a complete performance of the medical decision making.  My additional thoughts are as follows:   She is understandably frustrated about this ongoing intermittent bleeding that I still suspect is diverticular in nature.  Post polypectomy bleeding from 2 subcentimeter cold polypectomy sites would be very unlikely. Unfortunately, her tagged red cell scan was done about 12 hours after the onset of bleeding last evening, and no extravasation was seen at 2 hours   She remains hemodynamically stable and anemic.  I discussed the overall situation with her again and her limited diagnostic options.  I recommended a colonoscopy tomorrow to see if we might identify a culprit bleeding site amenable to endoscopic intervention. She was agreeable after discussion of procedure and risks.  The  benefits and risks of the planned procedure were described in detail with the patient or (when appropriate) their health care proxy.  Risks were outlined as including, but not limited to, bleeding, infection, perforation, adverse medication reaction leading to cardiac or pulmonary decompensation, pancreatitis (if ERCP).  The limitation of incomplete mucosal visualization was also discussed.  No guarantees or warranties were given.   40 minutes were spent on this encounter (including chart review, history/exam, counseling/coordination of care, and documentation) > 50% of that time was spent on counseling and coordination of care.   Nelida Meuse III Office:[phone]

## 2022-05-25 NOTE — H&P (View-Only) (Signed)
Patient ID: Ariel Bridges, female   DOB: [birth year], 61 y.o.   MRN: [id number]    Progress Note   Subjective   Day # 4  CC; GI bleeding,  Nuc med bleeding scan-ordered last p.m., done this morning around 9 AM-no evidence of active bleeding  Hemoglobin stable today at 8.5, post transfusions yesterday  CTA yesterday, no active extravasation, severe diffuse abdominal atherosclerotic disease, diffuse diverticulosis.  Patient says she feels okay, denies any abdominal discomfort.  She had an episode of bloody stool x1 early last evening, she has not had any further stools since    Objective   Vital signs in last 24 hours: Temp:  [97.6 F (36.4 C)-98.7 F (37.1 C)] 98.7 F (37.1 C) (07/21 0337) Pulse Rate:  [75-89] 84 (07/21 0749) Resp:  [11-18] 14 (07/21 0749) BP: (105-139)/(71-95) 105/71 (07/21 0749) SpO2:  [95 %-100 %] 99 % (07/21 0749) Weight:  [55.7 kg] 55.7 kg (07/21 0337) Last BM Date : 05/24/22 General:    Older white female in NAD Heart:  Regular rate and rhythm; no murmurs Lungs: Respirations even and unlabored, lungs CTA bilaterally Abdomen:  Soft, nontender and nondistended. Normal bowel sounds. Extremities:  Without edema. Neurologic:  Alert and oriented,  grossly normal neurologically. Psych:  Cooperative. Normal mood and affect.  Intake/Output from previous day: 07/20 0701 - 07/21 0700 In: 3325.3 [P.O.:1120; I.V.:1185.8; Blood:368.8; IV Piggyback:650.7] Out: 3250 [Urine:3250] Intake/Output this shift: Total I/O In: 75 [I.V.:62.5; IV Piggyback:12.6] Out: -   Lab Results: Recent Labs    05/23/22 0731 05/23/22 1736 05/24/22 0244 05/24/22 1753 05/25/22 0335  WBC 7.9  --  7.8  --  8.6  HGB 8.1*   < > 6.3* 8.3* 8.5*  HCT 23.1*   < > 18.6* 24.1* 23.6*  PLT 302  --  278  --  249   < > = values in this interval not displayed.   BMET Recent Labs    05/24/22 0244 05/24/22 0920 05/25/22 0335  NA 134* 137 134*  K 3.3* 4.0 4.0  CL 105 104 102  CO2 22  21* 21*  GLUCOSE 98 94 89  BUN '10 10 14  '$ CREATININE 0.73 0.74 0.80  CALCIUM 7.8* 8.1* 8.4*   LFT No results for input(s): "PROT", "ALBUMIN", "AST", "ALT", "ALKPHOS", "BILITOT", "BILIDIR", "IBILI" in the last 72 hours. PT/INR Recent Labs    05/22/22 1555  LABPROT 14.1  INR 1.1    Studies/Results: NM GI Blood Loss  Result Date: 05/25/2022 CLINICAL DATA:  GI bleeding EXAM: NUCLEAR MEDICINE GASTROINTESTINAL BLEEDING SCAN TECHNIQUE: Sequential abdominal images were obtained following intravenous administration of Tc-43mlabeled red blood cells. RADIOPHARMACEUTICALS:  20.0 MCi Tc-935mertechnetate in-vitro labeled red cells. COMPARISON:  None Available. FINDINGS: No evidence of radiotracer extravasation into the gastrointestinal tract to localize reported bleeding through 2 hours of planar imaging. Expected blood pool and urinary tract activity. IMPRESSION: No evidence of radiotracer extravasation into the gastrointestinal tract to localize reported bleeding through 2 hours of planar imaging. Electronically Signed   By: AlDelanna Ahmadi.D.   On: 05/25/2022 12:55   VAS [place] WITH/WO TBI  Result Date: 05/25/2022  LOWER EXTREMITY DOPPLER STUDY Patient Name:  Ariel SAIADate of Exam:   05/25/2022 Medical Rec #: [id number]       Accession #:    [id number] of Birth: [birth year]       Patient Gender: F Patient Age:   [age]ears Exam Location:  [facility] Procedure:      VAS [place] ABI WITH/WO TBI Referring Phys: Jamelle Haring --------------------------------------------------------------------------------  Indications: Claudication, and Significant aortoiliac disease. High Risk Factors: Hypertension, current smoker.  Comparison Study: No previous exam noted. Performing Technologist: Bobetta Lime BS RVT  Examination Guidelines: A complete evaluation includes at minimum, Doppler waveform signals and systolic blood pressure reading at the level of bilateral brachial, anterior tibial, and  posterior tibial arteries, when vessel segments are accessible. Bilateral testing is considered an integral part of a complete examination. Photoelectric Plethysmograph (PPG) waveforms and toe systolic pressure readings are included as required and additional duplex testing as needed. Limited examinations for reoccurring indications may be performed as noted.  ABI Findings: +---------+------------------+-----+----------+--------+ Right    Rt Pressure (mmHg)IndexWaveform  Comment  +---------+------------------+-----+----------+--------+ Brachial 204                    triphasic          +---------+------------------+-----+----------+--------+ PTA      97                0.48 monophasic         +---------+------------------+-----+----------+--------+ DP       99                0.49 monophasic         +---------+------------------+-----+----------+--------+ Great Toe                       Absent             +---------+------------------+-----+----------+--------+ +---------+------------------+-----+----------+--------+ Left     Lt Pressure (mmHg)IndexWaveform  Comment  +---------+------------------+-----+----------+--------+ Brachial 150                    biphasic  Abnormal +---------+------------------+-----+----------+--------+ PTA      123               0.60 monophasic         +---------+------------------+-----+----------+--------+ DP       102               0.50 monophasic         +---------+------------------+-----+----------+--------+ Great Toe                       Absent             +---------+------------------+-----+----------+--------+ +-------+-----------+-----------+------------+------------+ ABI/TBIToday's ABIToday's TBIPrevious ABIPrevious TBI +-------+-----------+-----------+------------+------------+ Right  0.49                                           +-------+-----------+-----------+------------+------------+ Left   0.60                                            +-------+-----------+-----------+------------+------------+  The left brachial arm pressure is much lower compared to the right and abnormal waveforms also noted on the left, which is indicative of probable proximal obstruction.  Summary: Right: Resting right ankle-brachial index indicates severe right lower extremity arterial disease. Absent great toe waveform. Left: Resting left ankle-brachial index indicates moderate left lower extremity arterial disease. Absent great toe waveform. *See table(s) above for measurements and observations.     Preliminary    DG CHEST PORT 1 VIEW  Result Date: 05/24/2022  CLINICAL DATA:  61 year old female with chest pain EXAM: PORTABLE CHEST 1 VIEW COMPARISON:  CTA 05/24/2022 FINDINGS: Prominent ascending aorta and tortuous descending aorta. Normal heart size. No focal consolidation, pleural effusion, or pneumothorax. Remote right clavicle fracture. No acute osseous abnormality. IMPRESSION: Prominent ascending aorta and tortuous descending aorta. If there is concern for thoracic aortic aneurysm or dissection, consider CTA of the chest for further evaluation. Electronically Signed   By: Placido Sou M.D.   On: 05/24/2022 09:13   CT ANGIO GI BLEED  Result Date: 05/24/2022 CLINICAL DATA:  Lower GI bleed EXAM: CTA ABDOMEN AND PELVIS WITHOUT AND WITH CONTRAST TECHNIQUE: Multidetector CT imaging of the abdomen and pelvis was performed using the standard protocol during bolus administration of intravenous contrast. Multiplanar reconstructed images and MIPs were obtained and reviewed to evaluate the vascular anatomy. RADIATION DOSE REDUCTION: This exam was performed according to the departmental dose-optimization program which includes automated exposure control, adjustment of the mA and/or kV according to patient size and/or use of iterative reconstruction technique. CONTRAST:  153m OMNIPAQUE IOHEXOL 350 MG/ML SOLN COMPARISON:  None  available FINDINGS: VASCULAR Aorta: Calcified and noncalcified atheromatous plaque seen throughout the abdominal aorta without aneurysmal dilatation or flow-limiting stenosis. A small displaced partially calcified linear structure is seen within the lumen of the distal infrarenal abdominal aorta, best seen on image 58 of series 6. It is suspicious for non flow limiting focal dissection. Celiac: Mild stenosis of the origin of the celiac artery. 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 Renals: Complete to near complete occlusion at the origin of the renal arteries bilaterally. IMA: Occluded at the origin. Opacification of distal branches indicative of retrograde collateral flow. Inflow: Mild to moderate diffuse stenosis of the left common iliac artery. Near complete occlusion at the origin of the left internal iliac artery. Moderate to severe multifocal stenosis of the distal left external iliac artery. Severe stenosis of the right common iliac artery. Near complete occlusion at the origin of the right internal iliac artery. Complete to near complete occlusion of the distal right external iliac artery. Proximal Outflow: Moderate stenosis of the right common femoral artery. Moderate stenosis of the left common femoral artery. Mild diffuse stenosis of the visualized proximal left superficial femoral artery. Veins: Hepatic, portal, superior mesenteric veins are patent. Review of the MIP images confirms the above findings. NON-VASCULAR Lower chest: 4 mm left lower lobe pulmonary nodule (image 1, series 6). Visualized lung bases otherwise unremarkable. Hepatobiliary: No focal liver abnormality is seen. No gallstones, gallbladder wall thickening, or biliary dilatation. Pancreas: Unremarkable. No pancreatic ductal dilatation or surrounding inflammatory changes. Spleen: Normal in size without focal abnormality. Adrenals/Urinary Tract: Adrenal glands are normal. Advanced atrophy of the right kidney.  Irregular thinning of the cortex of the left kidney likely due to prior ischemic or infectious etiology. Kidneys, ureters, and bladder otherwise unremarkable. Stomach/Bowel: No active hemorrhage identified within the gastrointestinal tract. There is diffuse colonic diverticulosis. There is fat stranding adjacent to the proximal descending colon suspicious for acute diverticulitis. No evidence of perforation or abscess formation. Appendix is normal. Lymphatic: No enlarged abdominal or pelvic lymph nodes. Reproductive: Uterus and bilateral adnexa are unremarkable. Other: No abdominal wall hernia or abnormality. No abdominopelvic ascites. Musculoskeletal: Deformity of the distal sacrum suspicious for prior healed fracture. This is best seen on image 88 of series 16. No acute osseous abnormality. IMPRESSION: VASCULAR 1. No active gastrointestinal hemorrhage. 2. 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 3.  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 4. 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 5. 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 NON-VASCULAR 1. Diffuse colonic diverticulosis with stranding of the fat adjacent to the proximal descending colon which is suspicious for acute diverticulitis. No evidence of perforation or abscess formation. 2. 4 mm left lower lobe pulmonary nodule (1/6). No follow-up needed if patient is low-risk.This recommendation follows the consensus statement: Guidelines for Management of Incidental Pulmonary Nodules Detected on CT Images: From the Fleischner Society 2017; Radiology 2017; 284:228-243. These results will be called to the ordering clinician or representative by the Radiologist Assistant, and communication documented in the PACS or [employer]. Electronically Signed   By: Miachel Roux M.D.   On: 05/24/2022 08:38       Assessment / Plan:     #55  61 year old white female admitted 3days ago with rectal bleeding felt to be consistent with recurrent diverticular bleeding.  She had undergone colonoscopy on 05/15/2022 for hematochezia and hemoglobin 8.5.  At that time she was noted to have a 6 mm polyp in the transverse colon and a 4 mm polyp in the sigmoid both removed with cold snare, there was focal erythema surrounding a solitary sigmoid diverticulum suspicious for recent diverticulitis, and pandiverticulosis.  CTA yesterday-no active bleeding/extravasation, severe mesenteric vascular disease noted  Patient had another episode of bloody stool last night x1-nuc med bleeding scan was ordered, however this was not completed until this morning about 9 AM had not had any further bleeding in the interim Study was negative for active bleed.  She required transfusions yesterday x2, hemoglobin has been stable since.  Etiology of her bleeding likely either secondary to stuttering diverticular bleed,, less likely but possible post polypectomy ulcer from polypectomies on 05/15/2022    #2 anemia secondary to GI blood loss #3 hypertension #4 EtOH abuse  Plan; advance to full liquid diet Continue to trend hemoglobin every 8 hours, transfuse to keep hemoglobin 8-patient had an episode of chest pain this admission felt secondary to demand ischemia with bleeding  Consider repeat colonoscopy GI will continue to follow      Principal Problem:   Acute lower GI bleeding Active Problems:   Tobacco abuse   Alcohol abuse   Essential hypertension   Acute blood loss anemia   Acute hyponatremia     LOS: 3 days   Amy Esterwood PA-C 05/25/2022, 1:27 PM  I have taken an interval history, thoroughly reviewed the chart and examined the patient. I agree with the Advanced Practitioner's note, impression and recommendations, and have recorded additional findings, impressions and recommendations below. I performed a substantive portion of this  encounter (>50% time spent), including a complete performance of the medical decision making.  My additional thoughts are as follows:   She is understandably frustrated about this ongoing intermittent bleeding that I still suspect is diverticular in nature.  Post polypectomy bleeding from 2 subcentimeter cold polypectomy sites would be very unlikely. Unfortunately, her tagged red cell scan was done about 12 hours after the onset of bleeding last evening, and no extravasation was seen at 2 hours   She remains hemodynamically stable and anemic.  I discussed the overall situation with her again and her limited diagnostic options.  I recommended a colonoscopy tomorrow to see if we might identify a culprit bleeding site amenable to endoscopic intervention. She was agreeable after discussion of procedure and risks.  The  benefits and risks of the planned procedure were described in detail with the patient or (when appropriate) their health care proxy.  Risks were outlined as including, but not limited to, bleeding, infection, perforation, adverse medication reaction leading to cardiac or pulmonary decompensation, pancreatitis (if ERCP).  The limitation of incomplete mucosal visualization was also discussed.  No guarantees or warranties were given.   40 minutes were spent on this encounter (including chart review, history/exam, counseling/coordination of care, and documentation) > 50% of that time was spent on counseling and coordination of care.   Nelida Meuse III Office:[phone]

## 2022-05-25 NOTE — Progress Notes (Signed)
Got a call from vascular lab that IV LINE in right ac got clotted, site noted to be reddened  when she came back, iv removed. Cold compress applied.  MD made aware.,with no order.Continue to monitor.

## 2022-05-25 NOTE — Progress Notes (Signed)
[facility]  PROGRESS NOTE  Ariel Bridges CBS:[id number] DOB: 1960/11/15 DOA: 05/21/2022 PCP: Practice, [facility]   Brief HPI:   61 year old female with history of recent hospitalization for acute lower GI bleed, diverticulosis, essential hypertension, chronic alcohol abuse who was admitted to [facility] on 05/21/2022 with suspected acute lower GI bleed after presenting from home to [facility], ED complaining of maroon appearing stool. -Patient with recent hospitalization  from 05/13/2022 - 05/15/22 with acute lower gastrointestinal bleed associated with acute blood loss anemia, during which time underwent colonoscopy with Dr. Bryan Lemma of [facility] gastro[facility]ology on 05/15/22, with this evaluation notable for nonbleeding 6 mm polyp in the transverse colon as well as nonbleeding 4 mm polyp in the sigmoid colon both of which were resected and removed.  Colonoscopy also notable for diverticulosis throughout the entire colon, without evidence of active bleed, while noting a single diverticulum in the sigmoid colon with some surrounding focal erythema, but in the absence of overt active bleed.  No EGD at that time.  In the ED hemoglobin was found to be 5.5.  Also found to have low sodium 119.  Patient also significant use of beer.   Subjective   Patient had 1 episode of rectal bleeding around 6 PM yesterday.  No clear RBC tagged scan was ordered last evening however it was done this morning.  It did not show any evidence of active bleeding.   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  Vascular surgery was consulted -Was started on Protonix gtt. which has been discontinued per GI -RBC tagged scan was done this morning, no active bleeding noted -Plan for colonoscopy in a.m.  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  Atorvastatin 40 mg daily, adequate hydration at least 2 L/day -Daily walking to and past the point of discomfort  Chest pain -Likely from hypertensive urgency as well as demand ischemia from active GI bleed -Troponin x2 obtained; 9, 23 -Chest pain has resolved at this time -EKG showed ST depression in leads V3 and V4 -We will continue to monitor. -We will likely need cardiac evaluation for coronary disease when stable from GI perspective  Hyponatremia -Resolved -Likely from beer potomania -Continue LR at 50 mill per hour  Hypomagnesemia -Magnesium is 1.5 -We will replace magnesium and follow serum magnesium in a.m.  Hypokalemia -Replete  Alcohol abuse -Continue with CIWA protocol -Continue thiamine folate -No signs and symptoms of alcohol withdrawal  Hypertension -Home medication on hold due to soft BP -Continue as needed hydralazine   Medications     feeding supplement  1 Container Oral TID BM   folic acid  1 mg Oral Daily   multivitamin with minerals  1 tablet Oral Daily   peg 3350 powder  0.5 kit Oral Once   And   peg 3350 powder  0.5 kit Oral Once   thiamine  100 mg Oral Daily  Data Reviewed:   CBG:  No results for input(s): "GLUCAP" in the last 168 hours.  SpO2: 99 % O2 Flow Rate (L/min): 0 L/min    Vitals:   05/24/22 1922 05/25/22 0005 05/25/22 0337 05/25/22 0749  BP: 124/85 121/84 119/79 105/71  Pulse:   83 83 84  Resp: _0 Temp: 97.7 F (36.5 C) 98.5 F (36.9 C) 98.7 F (37.1 C)   TempSrc: Oral Oral Oral   SpO2: 99% 95% 99% 99%  Weight:   55.7 kg   Height:          Data Reviewed:  Basic Metabolic Panel: Recent Labs  Lab 05/22/22 1034 05/22/22 1555 05/23/22 0345 05/24/22 0244 05/24/22 0920 05/25/22 0335  NA 131* 132* 132* 134* 137 134*  K 3.9  --  3.7 3.3* 4.0 4.0  CL 99  --  104 105 104 102  CO2 22  --  22 22 21* 21*  GLUCOSE 91  --  89 98 94 89  BUN 11  --  7* _1 CREATININE 0.82  --  0.82 0.73 0.74 0.80  CALCIUM 8.6*  --  8.3* 7.8* 8.1* 8.4*  MG  --  1.6*  --   --   --  1.5*    CBC: Recent Labs  Lab 05/21/22 2314 05/22/22 1034 05/22/22 1443 05/22/22 1555 05/23/22 0345 05/23/22 0731 05/23/22 1736 05/24/22 0244 05/24/22 1753 05/25/22 0335 05/25/22 1414  WBC 7.9 6.5  --  8.1 6.3 7.9  --  7.8  --  8.6  --   NEUTROABS 5.9 5.1  --   --   --   --   --   --   --   --   --   HGB 5.5* 8.4*   < > 8.2* 7.5* 8.1* 7.7* 6.3* 8.3* 8.5* 8.4*  HCT 15.6* 23.2*   < > 22.5* 21.2* 23.1* 22.7* 18.6* 24.1* 23.6* 25.0*  MCV 96.3 86.6  --  85.6 86.5 88.2  --  91.6  --  83.7  --   PLT 301 289  --  290 268 302  --  278  --  249  --    < > = values in this interval not displayed.    LFT Recent Labs  Lab 05/22/22 1034  AST 23  ALT 14  ALKPHOS 63  BILITOT 0.6  PROT 5.0*  ALBUMIN 2.8*     Antibiotics: Anti-infectives (From admission, onward)    Start     Dose/Rate Route Frequency Ordered Stop   05/24/22 1145  ciprofloxacin (CIPRO) IVPB 400 mg        400 mg 200 mL/hr over 60 Minutes Intravenous Every 12 hours 05/24/22 1048     05/24/22 1145  metroNIDAZOLE (FLAGYL) IVPB 500 mg        500 mg 100 mL/hr over 60 Minutes Intravenous Every 12 hours 05/24/22 1048          DVT prophylaxis: SCDs  Code Status: Full code  Family Communication: No family at bedside   CONSULTS gastroenterology   Objective    Physical  Examination:   General-appears in no acute distress Heart-S1-S2, regular, no murmur auscultated Lungs-clear to auscultation bilaterally, no wheezing or crackles auscultated Abdomen-soft, nontender, no organomegaly Extremities-no edema in the lower extremities Neuro-alert, oriented x3, no focal deficit noted  Status is: Inpatient: GI bleed       [place]   [facility] If 7PM-7AM, please contact night-coverage at [email or web address],  Office  [phone]   05/25/2022, 4:12 PM  LOS: 3 days

## 2022-05-26 ENCOUNTER — Encounter (HOSPITAL_COMMUNITY)
Admission: EM | Disposition: A | Discharge status: Home / Self Care | Payer: Self-pay | Source: Home / Self Care | Attending: Family Medicine

## 2022-05-26 ENCOUNTER — Inpatient Hospital Stay (HOSPITAL_COMMUNITY): Payer: 59 | Admitting: Anesthesiology

## 2022-05-26 ENCOUNTER — Encounter (HOSPITAL_COMMUNITY): Payer: Self-pay | Admitting: Internal Medicine

## 2022-05-26 DIAGNOSIS — K5730 Diverticulosis of large intestine without perforation or abscess without bleeding: Secondary | ICD-10-CM

## 2022-05-26 DIAGNOSIS — D62 Acute posthemorrhagic anemia: Secondary | ICD-10-CM

## 2022-05-26 DIAGNOSIS — F17200 Nicotine dependence, unspecified, uncomplicated: Secondary | ICD-10-CM

## 2022-05-26 DIAGNOSIS — I10 Essential (primary) hypertension: Secondary | ICD-10-CM

## 2022-05-26 HISTORY — PX: COLONOSCOPY WITH PROPOFOL: SHX5780

## 2022-05-26 LAB — HEMOGLOBIN AND HEMATOCRIT, BLOOD
HCT: 24.8 % — ABNORMAL LOW (ref 36.0–46.0)
Hemoglobin: 8.4 g/dL — ABNORMAL LOW (ref 12.0–15.0)

## 2022-05-26 LAB — MAGNESIUM: Magnesium: 1.6 mg/dL — ABNORMAL LOW (ref 1.7–2.4)

## 2022-05-26 SURGERY — COLONOSCOPY WITH PROPOFOL
Anesthesia: Monitor Anesthesia Care

## 2022-05-26 MED ORDER — ATENOLOL 50 MG PO TABS
50.0000 mg | ORAL_TABLET | Freq: Every day | ORAL | Status: DC
  Administered 2022-05-26 – 2022-05-27 (×2): 50 mg via ORAL
  Filled 2022-05-26 (×3): qty 1

## 2022-05-26 MED ORDER — LACTATED RINGERS IV SOLN: INTRAVENOUS | Status: DC | PRN

## 2022-05-26 MED ORDER — ZOLPIDEM TARTRATE 5 MG PO TABS
5.0000 mg | ORAL_TABLET | Freq: Once | ORAL | Status: AC
  Administered 2022-05-26: 5 mg via ORAL
  Filled 2022-05-26: qty 1

## 2022-05-26 MED ORDER — AMLODIPINE BESYLATE 5 MG PO TABS
5.0000 mg | ORAL_TABLET | Freq: Every day | ORAL | Status: DC
  Administered 2022-05-26 – 2022-05-27 (×2): 5 mg via ORAL
  Filled 2022-05-26 (×2): qty 1

## 2022-05-26 MED ORDER — PROPOFOL 500 MG/50ML IV EMUL
INTRAVENOUS | Status: DC | PRN
  Administered 2022-05-26: 150 ug/kg/min via INTRAVENOUS

## 2022-05-26 MED ORDER — PROPOFOL 10 MG/ML IV BOLUS
INTRAVENOUS | Status: DC | PRN
  Administered 2022-05-26 (×2): 30 mg via INTRAVENOUS

## 2022-05-26 MED ORDER — MAGNESIUM SULFATE 2 GM/50ML IV SOLN
2.0000 g | Freq: Once | INTRAVENOUS | Status: AC
  Administered 2022-05-26: 2 g via INTRAVENOUS
  Filled 2022-05-26: qty 50

## 2022-05-26 SURGICAL SUPPLY — 22 items

## 2022-05-26 NOTE — Transfer of Care (Signed)
Immediate Anesthesia Transfer of Care Note  Patient: Ariel Bridges  Procedure(s) Performed: COLONOSCOPY WITH PROPOFOL  Patient Location: PACU and Endoscopy Unit  Anesthesia Type:MAC  Level of Consciousness: drowsy and patient cooperative  Airway & Oxygen Therapy: Patient Spontanous Breathing  Post-op Assessment: Report given to RN and Post -op Vital signs reviewed and stable  Post vital signs: Reviewed and stable  Last Vitals:  Vitals Value Taken Time  BP 144/83   Temp    Pulse 90 05/26/22 1242  Resp 15 05/26/22 1242  SpO2 97 % 05/26/22 1242  Vitals shown include unvalidated device data.  Last Pain:  Vitals:   05/26/22 1118  TempSrc: Oral  PainSc: 2          Complications: No notable events documented.

## 2022-05-26 NOTE — Anesthesia Preprocedure Evaluation (Addendum)
Anesthesia Evaluation  Patient identified by MRN, date of birth, ID band Patient awake    Reviewed: Allergy & Precautions, NPO status , Patient's Chart, lab work & pertinent test results  Airway Mallampati: II  TM Distance: >3 FB Neck ROM: Full    Dental  (+) Poor Dentition, Missing, Dental Advisory Given   Pulmonary Current Smoker and Patient abstained from smoking.,    breath sounds clear to auscultation       Cardiovascular hypertension, Pt. on medications  Rhythm:Regular Rate:Normal     Neuro/Psych    GI/Hepatic Neg liver ROS, GERD  Medicated,  Endo/Other  negative endocrine ROS  Renal/GU negative Renal ROS     Musculoskeletal negative musculoskeletal ROS (+)   Abdominal Normal abdominal exam  (+)   Peds  Hematology negative hematology ROS (+)   Anesthesia Other Findings   Reproductive/Obstetrics                            Anesthesia Physical Anesthesia Plan  ASA: 2  Anesthesia Plan: MAC   Post-op Pain Management:    Induction: Intravenous  PONV Risk Score and Plan: 0 and Propofol infusion  Airway Management Planned: Natural Airway and Simple Face Mask  Additional Equipment: None  Intra-op Plan:   Post-operative Plan:   Informed Consent: I have reviewed the patients History and Physical, chart, labs and discussed the procedure including the risks, benefits and alternatives for the proposed anesthesia with the patient or authorized representative who has indicated his/her understanding and acceptance.       Plan Discussed with: CRNA  Anesthesia Plan Comments:        Anesthesia Quick Evaluation

## 2022-05-26 NOTE — Progress Notes (Signed)
[facility]  PROGRESS NOTE  Ariel Bridges KPT:[id number] DOB: Nov 06, 1960 DOA: 05/21/2022 PCP: Practice, [facility]   Brief HPI:   61 year old female with history of recent hospitalization for acute lower GI bleed, diverticulosis, essential hypertension, chronic alcohol abuse who was admitted to [facility] on 05/21/2022 with suspected acute lower GI bleed after presenting from home to [facility], ED complaining of maroon appearing stool. -Patient with recent hospitalization  from 05/13/2022 - 05/15/22 with acute lower gastrointestinal bleed associated with acute blood loss anemia, during which time underwent colonoscopy with Dr. Bryan Lemma of [facility] gastroenterology on 05/15/22, with this evaluation notable for nonbleeding 6 mm polyp in the transverse colon as well as nonbleeding 4 mm polyp in the sigmoid colon both of which were resected and removed.  Colonoscopy also notable for diverticulosis throughout the entire colon, without evidence of active bleed, while noting a single diverticulum in the sigmoid colon with some surrounding focal erythema, but in the absence of overt active bleed.  No EGD at that time.  In the ED hemoglobin was found to be 5.5.  Also found to have low sodium 119.  Patient also significant use of beer.   Subjective   Patient seen and examined, denies any complaints.  Bowel prep complete for colonoscopy today.   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  Vascular surgery was consulted -Was started  on Protonix gtt. 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  Atorvastatin 40 mg daily, adequate hydration at least 2 L/day -Daily walking to and past the point of discomfort  Chest pain -Likely from hypertensive urgency as well as demand ischemia from active GI bleed -Troponin x2 obtained; 9, 23 -Chest pain has resolved at this time -EKG showed ST depression in leads V3 and V4 -We will continue to monitor. -We will likely need cardiac evaluation for coronary disease when stable from GI perspective  Hyponatremia -Resolved -Likely from beer potomania -Continue LR at 50 mill per hour  Hypomagnesemia -Magnesium is 1.6 -We will give 2 g of magnesium sulfate IV x1  Hypokalemia -Replete  Alcohol abuse -Continue with CIWA protocol -Continue thiamine folate -No signs and symptoms of alcohol withdrawal  Hypertension -Home medication on hold due to soft BP -Continue as needed hydralazine   Medications     feeding supplement  1 Container Oral TID BM   folic acid  1 mg Oral Daily   multivitamin with minerals  1 tablet Oral Daily   thiamine  100 mg Oral Daily     Data Reviewed:   CBG:  No results for input(s): "GLUCAP" in the last 168 hours.  SpO2: 95 % O2 Flow Rate (L/min): 0 L/min    Vitals:   05/25/22 1640 05/25/22 1949 05/26/22 0048 05/26/22 0526  BP:  Marland Kitchen)  161/98 (!) 137/92 121/82  Pulse:  82 87 91  Resp:  '11 15 17  '$ Temp:  97.6 F (36.4 C) 98.5 F (36.9 C) (!) 97.5 F (36.4 C)  TempSrc:  Oral Oral Oral  SpO2: 99% 99% 97% 95%  Weight:    54.7 kg  Height:           Data Reviewed:  Basic Metabolic Panel: Recent Labs  Lab 05/22/22 1034 05/22/22 1555 05/23/22 0345 05/24/22 0244 05/24/22 0920 05/25/22 0335 05/26/22 0536  NA 131* 132* 132* 134* 137 134*  --   K 3.9  --  3.7 3.3* 4.0 4.0  --   CL 99  --  104 105 104 102  --   CO2 22  --  22 22 21* 21*  --   GLUCOSE 91  --  89 98 94 89  --   BUN 11  --  7* '10 10 14  '$ --   CREATININE 0.82  --  0.82 0.73 0.74 0.80  --   CALCIUM 8.6*  --  8.3* 7.8* 8.1* 8.4*  --   MG  --  1.6*  --   --   --  1.5* 1.6*    CBC: Recent Labs  Lab 05/21/22 2314 05/22/22 1034 05/22/22 1443 05/22/22 1555 05/23/22 0345 05/23/22 0731 05/23/22 1736 05/24/22 0244 05/24/22 1753 05/25/22 0335 05/25/22 1414 05/25/22 2135 05/26/22 0536  WBC 7.9 6.5  --  8.1 6.3 7.9  --  7.8  --  8.6  --   --   --   NEUTROABS 5.9 5.1  --   --   --   --   --   --   --   --   --   --   --   HGB 5.5* 8.4*   < > 8.2* 7.5* 8.1*   < > 6.3* 8.3* 8.5* 8.4* 9.7* 8.4*  HCT 15.6* 23.2*   < > 22.5* 21.2* 23.1*   < > 18.6* 24.1* 23.6* 25.0* 28.8* 24.8*  MCV 96.3 86.6  --  85.6 86.5 88.2  --  91.6  --  83.7  --   --   --   PLT 301 289  --  290 268 302  --  278  --  249  --   --   --    < > = values in this interval not displayed.    LFT Recent Labs  Lab 05/22/22 1034  AST 23  ALT 14  ALKPHOS 63  BILITOT 0.6  PROT 5.0*  ALBUMIN 2.8*     Antibiotics: Anti-infectives (From admission, onward)    Start     Dose/Rate Route Frequency Ordered Stop   05/24/22 1145  ciprofloxacin (CIPRO) IVPB 400 mg        400 mg 200 mL/hr over 60 Minutes Intravenous Every 12 hours 05/24/22 1048     05/24/22 1145  metroNIDAZOLE (FLAGYL) IVPB 500 mg        500 mg 100 mL/hr over 60 Minutes Intravenous Every 12 hours 05/24/22 1048          DVT prophylaxis: SCDs  Code Status: Full code  Family Communication: No family at bedside   CONSULTS gastroenterology   Objective    Physical Examination:   General-appears in no acute  distress Heart-S1-S2, regular, no murmur auscultated Lungs-clear to auscultation bilaterally, no wheezing or crackles auscultated Abdomen-soft, nontender, no organomegaly Extremities-no edema in the lower extremities Neuro-alert, oriented x3, no focal deficit noted  Status  is: Inpatient: GI bleed       [place]   [facility] If 7PM-7AM, please contact night-coverage at [email or web address], Office  [phone]   05/26/2022, 8:17 AM  LOS: 4 days

## 2022-05-26 NOTE — Op Note (Signed)
[facility] Patient Name: Ariel Bridges Procedure Date : 05/26/2022 MRN: [id number] Attending MD: Estill Cotta. Loletha Carrow , MD Date of Birth: 1960/11/13 CSN: [id number] Age: 61 Admit Type: Inpatient Procedure:                Colonoscopy Indications:              Hematochezia, Acute post hemorrhagic anemia                           recurrent diverticular bleeding - second admission                            in 2 weeks. recent inpatient colonoscopy with cold                            polypectomy two dimunutive TC and Sig polyps.                           Multiple negative imaging studies Providers:                Mallie Mussel L. Loletha Carrow, MD, Carlyn Reichert, RN, William Dalton, Technician Referring MD:             [facility] Medicines:                Monitored Anesthesia Care Complications:            No immediate complications. Estimated Blood Loss:     Estimated blood loss: none. Procedure:                Pre-Anesthesia Assessment:                           - Prior to the procedure, a History and Physical                            was performed, and patient medications and                            allergies were reviewed. The patient's tolerance of                            previous anesthesia was also reviewed. The risks                            and benefits of the procedure and the sedation                            options and risks were discussed with the patient.                            All questions were answered, and informed consent                            was obtained.  Prior Anticoagulants: The patient has                            taken no previous anticoagulant or antiplatelet                            agents. ASA Grade Assessment: III - A patient with                            severe systemic disease. After reviewing the risks                            and benefits, the patient was deemed in                             satisfactory condition to undergo the procedure.                           After obtaining informed consent, the colonoscope                            was passed under direct vision. Throughout the                            procedure, the patient's blood pressure, pulse, and                            oxygen saturations were monitored continuously. The                            [id number]-HQ190L ([id number]) Olympus colonoscope was                            introduced through the anus and advanced to the the                            terminal ileum, with identification of the                            appendiceal orifice and IC valve. The colonoscopy                            was somewhat difficult due to multiple diverticula                            in the colon, a redundant colon and a tortuous                            colon. Successful completion of the procedure was                            aided by using manual pressure and straightening  and shortening the scope to obtain bowel loop                            reduction. The patient tolerated the procedure                            well. The quality of the bowel preparation was                            excellent. The ileocecal valve, appendiceal                            orifice, and rectum were photographed. Scope In: 12:11:13 PM Scope Out: 12:35:19 PM Scope Withdrawal Time: 0 hours 15 minutes 37 seconds  Total Procedure Duration: 0 hours 24 minutes 6 seconds  Findings:      The perianal and digital rectal examinations were normal.      The terminal ileum appeared normal.      Many diverticula were found in the entire colon. Extensive lavage and       visualization revealed no stigmata of bleeding on any diverticular       openings.      No polypectomy sites were seen.      No fresh or old blood in the colon or ileum.      The exam was otherwise without abnormality on direct and retroflexion        views. Impression:               - The examined portion of the ileum was normal.                           - Diverticulosis in the entire examined colon.                           - The examination was otherwise normal on direct                            and retroflexion views.                           - No specimens collected. Recommendation:           - Return patient to hospital ward for ongoing care.                           - Resume regular diet.                           - If recurrent hematochezia, obtain another STAT                            CTA abdomen and call for surgical consultation.                           Discontinue antibiotics. Procedure Code(s):        --- Professional ---  45378, Colonoscopy, flexible; diagnostic, including                            collection of specimen(s) by brushing or washing,                            when performed (separate procedure) Diagnosis Code(s):        --- Professional ---                           K92.1, Melena (includes Hematochezia)                           D62, Acute posthemorrhagic anemia                           K57.30, Diverticulosis of large intestine without                            perforation or abscess without bleeding CPT copyright 2019 [employer]. All rights reserved. The codes documented in this report are preliminary and upon coder review may  be revised to meet current compliance requirements. Tahnee Cifuentes L. Loletha Carrow, MD 05/26/2022 12:53:10 PM This report has been signed electronically. Number of Addenda: 0

## 2022-05-26 NOTE — Interval H&P Note (Signed)
History and Physical Interval Note:  05/26/2022 12:01 PM  Ariel Bridges  has presented today for surgery, with the diagnosis of GI bleed.  The various methods of treatment have been discussed with the patient and family. After consideration of risks, benefits and other options for treatment, the patient has consented to  Procedure(s): COLONOSCOPY WITH PROPOFOL (N/A) as a surgical intervention.  The patient's history has been reviewed, patient examined, no change in status, stable for surgery.  I have reviewed the patient's chart and labs.  Questions were answered to the patient's satisfaction.    Hgb stable 8.6 this am.  Completed bowel preparation  Nelida Meuse III

## 2022-05-26 NOTE — Anesthesia Postprocedure Evaluation (Signed)
Anesthesia Post Note  Patient: Ariel Bridges  Procedure(s) Performed: COLONOSCOPY WITH PROPOFOL     Patient location during evaluation: PACU Anesthesia Type: MAC Level of consciousness: awake and alert Pain management: pain level controlled Vital Signs Assessment: post-procedure vital signs reviewed and stable Respiratory status: spontaneous breathing, nonlabored ventilation, respiratory function stable and patient connected to nasal cannula oxygen Cardiovascular status: stable and blood pressure returned to baseline Postop Assessment: no apparent nausea or vomiting Anesthetic complications: no   No notable events documented.  Last Vitals:  Vitals:   05/26/22 1241 05/26/22 1250  BP: (!) 144/83 (!) 179/95  Pulse:  (!) 102  Resp: 16 13  Temp:    SpO2: 98% 99%    Last Pain:  Vitals:   05/26/22 1250  TempSrc:   PainSc: 0-No pain                 Effie Berkshire

## 2022-05-27 ENCOUNTER — Encounter (HOSPITAL_COMMUNITY): Payer: Self-pay | Admitting: Gastroenterology

## 2022-05-27 LAB — CBC
HCT: 24.4 % — ABNORMAL LOW (ref 36.0–46.0)
Hemoglobin: 8.5 g/dL — ABNORMAL LOW (ref 12.0–15.0)
MCH: 30.8 pg (ref 26.0–34.0)
MCHC: 34.8 g/dL (ref 30.0–36.0)
MCV: 88.4 fL (ref 80.0–100.0)
Platelets: 306 10*3/uL (ref 150–400)
RBC: 2.76 MIL/uL — ABNORMAL LOW (ref 3.87–5.11)
RDW: 18.4 % — ABNORMAL HIGH (ref 11.5–15.5)
WBC: 9.8 10*3/uL (ref 4.0–10.5)
nRBC: 0 % (ref 0.0–0.2)

## 2022-05-27 LAB — MAGNESIUM: Magnesium: 1.6 mg/dL — ABNORMAL LOW (ref 1.7–2.4)

## 2022-05-27 MED ORDER — ZOLPIDEM TARTRATE 5 MG PO TABS
5.0000 mg | ORAL_TABLET | Freq: Once | ORAL | Status: AC
Start: 2022-05-27 — End: 2022-05-27
  Administered 2022-05-27: 5 mg via ORAL
  Filled 2022-05-27: qty 1

## 2022-05-27 NOTE — Plan of Care (Signed)

## 2022-05-27 NOTE — Progress Notes (Signed)
Patient has two staples in the lower medial of the head that ED placed when admitted into the hospital. MD verbal order to remove staples. RN pulled two staples off from the head. Patient tolerated well. Site is clean, dry, and intact.

## 2022-05-27 NOTE — Progress Notes (Signed)
[facility] Progress Note  Chief Complaint: Diverticular bleeding with anemia  History:  No bleeding since I saw her at the time of colonoscopy yesterday.  Therefore, no bleeding for over 48 hours.  She had a regular diet yesterday without nausea vomiting or abdominal pain. She remains frustrated by this ongoing problem and a prolonged hospital stay.  ROS: Cardiovascular: Denies chest pain Respiratory: Denies dyspnea Urinary: Denies dysuria  Objective:   Current Facility-Administered Medications:    acetaminophen (TYLENOL) tablet 650 mg, 650 mg, Oral, Q6H PRN, 650 mg at 05/26/22 1359 **OR** acetaminophen (TYLENOL) suppository 650 mg, 650 mg, Rectal, Q6H PRN, Danis, Estill Cotta III, MD   amLODipine (NORVASC) tablet 5 mg, 5 mg, Oral, QHS, Danis, Estill Cotta III, MD, 5 mg at 05/26/22 2047   atenolol (TENORMIN) tablet 50 mg, 50 mg, Oral, QHS, Danis, Estill Cotta III, MD, 50 mg at 05/26/22 2047   feeding supplement (BOOST / RESOURCE BREEZE) liquid 1 Container, 1 Container, Oral, TID BM, Nelida Meuse III, MD, 1 Container at 16/10/96 0454   folic acid (FOLVITE) tablet 1 mg, 1 mg, Oral, Daily, Danis, Estill Cotta III, MD, 1 mg at 05/26/22 1347   hydrALAZINE (APRESOLINE) injection 5 mg, 5 mg, Intravenous, Q6H PRN, Nelida Meuse III, MD, 5 mg at 05/26/22 1046   lactated ringers infusion, , Intravenous, Continuous, Danis, Estill Cotta III, MD, Last Rate: 50 mL/hr at 05/25/22 0800, Infusion Verify at 05/25/22 0800   multivitamin with minerals tablet 1 tablet, 1 tablet, Oral, Daily, Danis, Estill Cotta III, MD, 1 tablet at 05/26/22 1347   nicotine (NICODERM CQ - dosed in mg/24 hours) patch 14 mg, 14 mg, Transdermal, Daily PRN, Doran Stabler, MD   Oral care mouth rinse, 15 mL, Mouth Rinse, PRN, Loletha Carrow, Estill Cotta III, MD   thiamine tablet 100 mg, 100 mg, Oral, Daily, Danis, Estill Cotta III, MD, 100 mg at 05/26/22 1346   lactated ringers 50 mL/hr at 05/25/22 0800     Vital signs in last 24 hrs: Vitals:   05/27/22 0032  05/27/22 0532  BP: 120/80 (!) 144/93  Pulse: 76 76  Resp: 20 17  Temp: 97.6 F (36.4 C) 97.6 F (36.4 C)  SpO2: 92% 96%    Intake/Output Summary (Last 24 hours) at 05/27/2022 0726 Last data filed at 05/27/2022 0532 Gross per 24 hour  Intake 500 ml  Output 1700 ml  Net -1200 ml     Physical Exam Sitting up comfortably in bed reading Cardiac: RRR without murmurs, S1S2 heard, no peripheral edema Pulm: clear to auscultation bilaterally, normal RR and effort noted Abdomen: soft, no tenderness, with active bowel sounds. No guarding or palpable hepatosplenomegaly Skin; warm and dry, no jaundice  + pale  Recent Labs:     Latest Ref Rng & Units 05/26/2022    5:36 AM 05/25/2022    9:35 PM 05/25/2022    2:14 PM  CBC  Hemoglobin 12.0 - 15.0 g/dL 8.4  9.7  8.4   Hematocrit 36.0 - 46.0 % 24.8  28.8  25.0     Recent Labs  Lab 05/22/22 1555  INR 1.1      Latest Ref Rng & Units 05/25/2022    3:35 AM 05/24/2022    9:20 AM 05/24/2022    2:44 AM  CMP  Glucose 70 - 99 mg/dL 89  94  98   BUN 8 - 23 mg/dL '14  10  10   '$ Creatinine 0.44 - 1.00 mg/dL 0.80  0.74  0.73   Sodium 135 - 145 mmol/L 134  137  134   Potassium 3.5 - 5.1 mmol/L 4.0  4.0  3.3   Chloride 98 - 111 mmol/L 102  104  105   CO2 22 - 32 mmol/L '21  21  22   '$ Calcium 8.9 - 10.3 mg/dL 8.4  8.1  7.8      Assessment & Plan  Assessment: Colonic diverticular bleeding with anemia of acute blood loss.  This has been the second episode and hospitalization in 2 weeks, multiple imaging studies have failed to catch it, 2 colonoscopies done without finding a culprit diverticulum.  Hemoglobin stable since yesterday, no bleeding over 48 hours   Plan: Continue regular diet, observe in hospital until tomorrow.  If no bleeding at that point, she may be ready for discharge.  Our service will continue to follow, and Dr. Lyndel Safe will manage the consult service starting tomorrow.  I will discontinue the every 6 hour hemoglobin checks  CBC  tomorrow morning  If recurrent hematochezia, sent for stat CT angiogram of abdomen and obtain CBC.  If that should occur and a CT angiogram identifies active bleeding, radiology call he should contact IR for attempt at abdominal angiogram for localization and control.  Please see previous notes for this patient's extensive atherosclerotic disease and IR's assessment that peripheral access would likely be very challenging due to her vascular disease.   Nelida Meuse III Office: [phone]

## 2022-05-27 NOTE — Progress Notes (Addendum)
[facility]  PROGRESS NOTE  Ariel Bridges LZJ:[id number] DOB: 11/05/1961 DOA: 05/21/2022 PCP: Practice, [facility]   Brief HPI:   61 year old female with history of recent hospitalization for acute lower GI bleed, diverticulosis, essential hypertension, chronic alcohol abuse who was admitted to [facility] on 05/21/2022 with suspected acute lower GI bleed after presenting from home to [facility], ED complaining of maroon appearing stool. -Patient with recent hospitalization  from 05/13/2022 - 05/15/22 with acute lower gastrointestinal bleed associated with acute blood loss anemia, during which time underwent colonoscopy with Dr. Bryan Lemma of [facility] gastroenterology on 05/15/22, with this evaluation notable for nonbleeding 6 mm polyp in the transverse colon as well as nonbleeding 4 mm polyp in the sigmoid colon both of which were resected and removed.  Colonoscopy also notable for diverticulosis throughout the entire colon, without evidence of active bleed, while noting a single diverticulum in the sigmoid colon with some surrounding focal erythema, but in the absence of overt active bleed.  No EGD at that time.  In the ED hemoglobin was found to be 5.5.  Also found to have low sodium 119.  Patient also significant use of beer.   Subjective   Patient seen, underwent colonoscopy yesterday which showed diverticulosis in the entire colon but no active bleeding.   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  Vascular surgery was consulted -Was started on Protonix gtt. which has been  discontinued per GI -RBC tagged scan was done this morning, no active bleeding noted -Colonoscopy done yesterday shows diverticulosis, no active bleeding. -Gastroenterology has signed off, recommends to get another CTA done this if patient starts to bleed.  If no bleeding till next 24 hours, she can follow-up with Dr. Lyndel Safe as outpatient.  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  Atorvastatin 40 mg daily, adequate hydration at least 2 L/day -Daily walking to and past the point of discomfort  Chest pain -Likely from hypertensive urgency as well as demand ischemia from active GI bleed -Troponin x2 obtained; 9, 23 -Chest pain has resolved at this time -EKG showed ST depression in leads V3 and V4 -We will continue to monitor. -We will likely need cardiac evaluation for coronary disease when stable from GI perspective  Hyponatremia -Resolved -Likely from beer potomania -Continue LR at 50 mill per hour  Hypomagnesemia -Magnesium is 1.6 -2 g of magnesium sulfate x1 was given yesterday -We will recheck serum magnesium today  Hypokalemia -Replete  Alcohol abuse -Continue with CIWA protocol -Continue thiamine folate -No signs and symptoms of alcohol withdrawal  Hypertension -Home medication on hold due to soft BP -Continue as needed hydralazine   Medications     amLODipine  5 mg Oral QHS   atenolol  50 mg Oral QHS   feeding supplement  1 Container Oral TID BM   folic acid  1 mg Oral Daily  multivitamin with minerals  1 tablet Oral Daily   thiamine  100 mg Oral Daily     Data Reviewed:   CBG:  No results for  input(s): "GLUCAP" in the last 168 hours.  SpO2: 98 % O2 Flow Rate (L/min): 0 L/min    Vitals:   05/26/22 1938 05/27/22 0032 05/27/22 0532 05/27/22 1116  BP: 115/84 120/80 (!) 144/93 132/84  Pulse: 91 76 76 71  Resp: '20 20 17 17  '$ Temp: 97.6 F (36.4 C) 97.6 F (36.4 C) 97.6 F (36.4 C) 98.1 F (36.7 C)  TempSrc: Oral Oral Oral Oral  SpO2: 100% 92% 96% 98%  Weight:   54.7 kg   Height:          Data Reviewed:  Basic Metabolic Panel: Recent Labs  Lab 05/22/22 1034 05/22/22 1555 05/23/22 0345 05/24/22 0244 05/24/22 0920 05/25/22 0335 05/26/22 0536  NA 131* 132* 132* 134* 137 134*  --   K 3.9  --  3.7 3.3* 4.0 4.0  --   CL 99  --  104 105 104 102  --   CO2 22  --  22 22 21* 21*  --   GLUCOSE 91  --  89 98 94 89  --   BUN 11  --  7* '10 10 14  '$ --   CREATININE 0.82  --  0.82 0.73 0.74 0.80  --   CALCIUM 8.6*  --  8.3* 7.8* 8.1* 8.4*  --   MG  --  1.6*  --   --   --  1.5* 1.6*    CBC: Recent Labs  Lab 05/21/22 2314 05/22/22 1034 05/22/22 1443 05/23/22 0345 05/23/22 0731 05/23/22 1736 05/24/22 0244 05/24/22 1753 05/25/22 0335 05/25/22 1414 05/25/22 2135 05/26/22 0536 05/27/22 0611  WBC 7.9 6.5   < > 6.3 7.9  --  7.8  --  8.6  --   --   --  9.8  NEUTROABS 5.9 5.1  --   --   --   --   --   --   --   --   --   --   --   HGB 5.5* 8.4*   < > 7.5* 8.1*   < > 6.3*   < > 8.5* 8.4* 9.7* 8.4* 8.5*  HCT 15.6* 23.2*   < > 21.2* 23.1*   < > 18.6*   < > 23.6* 25.0* 28.8* 24.8* 24.4*  MCV 96.3 86.6   < > 86.5 88.2  --  91.6  --  83.7  --   --   --  88.4  PLT 301 289   < > 268 302  --  278  --  249  --   --   --  306   < > = values in this interval not displayed.    LFT Recent Labs  Lab 05/22/22 1034  AST 23  ALT 14  ALKPHOS 63  BILITOT 0.6  PROT 5.0*  ALBUMIN 2.8*     Antibiotics: Anti-infectives (From admission, onward)    Start     Dose/Rate Route Frequency Ordered Stop   05/24/22 1145  ciprofloxacin (CIPRO) IVPB 400 mg  Status:  Discontinued         400 mg 200 mL/hr over 60 Minutes Intravenous Every 12 hours 05/24/22 1048 05/26/22 1338   05/24/22 1145  metroNIDAZOLE (FLAGYL) IVPB 500 mg  Status:  Discontinued        500 mg 100 mL/hr over 60 Minutes  Intravenous Every 12 hours 05/24/22 1048 05/26/22 1338        DVT prophylaxis: SCDs  Code Status: Full code  Family Communication: No family at bedside   CONSULTS gastroenterology   Objective    Physical Examination:  General-appears in no acute distress Heart-S1-S2, regular, no murmur auscultated Lungs-clear to auscultation bilaterally, no wheezing or crackles auscultated Abdomen-soft, nontender, no organomegaly Extremities-no edema in the lower extremities Neuro-alert, oriented x3, no focal deficit noted  Status is: Inpatient: GI bleed       [place]   [facility] If 7PM-7AM, please contact night-coverage at [email or web address], Office  [phone]   05/27/2022, 1:43 PM  LOS: 5 days

## 2022-05-28 LAB — CBC
HCT: 27 % — ABNORMAL LOW (ref 36.0–46.0)
Hemoglobin: 8.9 g/dL — ABNORMAL LOW (ref 12.0–15.0)
MCH: 30 pg (ref 26.0–34.0)
MCHC: 33 g/dL (ref 30.0–36.0)
MCV: 90.9 fL (ref 80.0–100.0)
Platelets: 304 10*3/uL (ref 150–400)
RBC: 2.97 MIL/uL — ABNORMAL LOW (ref 3.87–5.11)
RDW: 17.3 % — ABNORMAL HIGH (ref 11.5–15.5)
WBC: 10.8 10*3/uL — ABNORMAL HIGH (ref 4.0–10.5)
nRBC: 0 % (ref 0.0–0.2)

## 2022-05-28 MED ORDER — MAGNESIUM SULFATE 2 GM/50ML IV SOLN
2.0000 g | Freq: Once | INTRAVENOUS | Status: AC
  Administered 2022-05-28: 2 g via INTRAVENOUS
  Filled 2022-05-28: qty 50

## 2022-05-28 MED ORDER — ATORVASTATIN CALCIUM 40 MG PO TABS: 40.0000 mg | ORAL_TABLET | Freq: Every day | ORAL | 11 refills | Status: AC

## 2022-05-28 MED ORDER — ASPIRIN 81 MG PO TBEC: 81.0000 mg | DELAYED_RELEASE_TABLET | Freq: Every day | ORAL | 3 refills | Status: AC

## 2022-05-28 NOTE — Progress Notes (Addendum)
Daily Rounding Note  05/28/2022, 9:33 AM  LOS: 6 days   SUBJECTIVE:   Chief complaint:    Recurrent painless hematochezia.  Presumed diverticular bleed.  No stool or bleeding since bowel prep a few days ago.  Her norm is bowel movements every 3 to 4 days.  Tolerating diet.  Has not been up ambulating as she is still attached to cardiac monitor.  Not feeling dizzy but is tired.  OBJECTIVE:         Vital signs in last 24 hours:    Temp:  [98.1 F (36.7 C)-99.5 F (37.5 C)] 98.6 F (37 C) (07/24 0804) Pulse Rate:  [71-79] 73 (07/24 0914) Resp:  [17-19] 17 (07/24 0914) BP: (107-135)/(74-89) 107/75 (07/24 0914) SpO2:  [93 %-100 %] 96 % (07/24 0804) Weight:  [54.2 kg] 54.2 kg (07/24 0451) Last BM Date : 05/26/22 Filed Weights   05/26/22 0526 05/27/22 0532 05/28/22 0451  Weight: 54.7 kg 54.7 kg 54.2 kg   General: Looks well.  NAD Heart: RRR Chest: No labored breathing or cough Abdomen: Soft without tenderness.  Active bowel sounds.  No distention. Extremities: No CCE. Neuro/Psych: Oriented x3.  No weakness or deficits.  Intake/Output from previous day: 07/23 0701 - 07/24 0700 In: 517 [P.O.:517] Out: 3500 [Urine:3500]  Intake/Output this shift: Total I/O In: 360 [P.O.:360] Out: 500 [Urine:500]  Lab Results: Recent Labs    05/26/22 0536 05/27/22 0611 05/28/22 0541  WBC  --  9.8 10.8*  HGB 8.4* 8.5* 8.9*  HCT 24.8* 24.4* 27.0*  PLT  --  306 304   BMET No results for input(s): "NA", "K", "CL", "CO2", "GLUCOSE", "BUN", "CREATININE", "CALCIUM" in the last 72 hours. LFT No results for input(s): "PROT", "ALBUMIN", "AST", "ALT", "ALKPHOS", "BILITOT", "BILIDIR", "IBILI" in the last 72 hours. PT/INR No results for input(s): "LABPROT", "INR" in the last 72 hours. Hepatitis Panel No results for input(s): "HEPBSAG", "HCVAB", "HEPAIGM", "HEPBIGM" in the last 72 hours.  Studies/Results: No results  found.  ASSESMENT:     LGIB, diverticular.  Recent abx for diverticulitis.  Currently on 2nd admission within 2 weeks for LGIB.  Colonoscopy #2 on 05/2021 revealing pandiverticulosis but no culprit tic.  Colonoscopy #1 on 7/11 with removal of inflammatory polyp from sigmoid, diverticular associated erythema suspicious for recent diverticulitis, Pandiverticulosis.  No bleeding on nuc med study 7/21.  CT angio 7/20 With no active bleeding.  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  Severe proximal SMA stenosis, near occlusion at origin of both renal arteries, extensive occlusive disease in the lower extremity arteries.  Nonvascular findings included diffuse colonic diverticulosis with changes suspicious for diverticulitis in the proximal descending colon.  4 mm LLL pulmonary nodule.  Acute blood loss anemia. 4 PRBCs thus far.  Hgb 5.5... 8.9.  stable/improved over last 5 d.    Alcohol use disorder.    GERD sxs, resolved with Protonix.  Has not had EGD.      Pulm nodule per CT 7/20.  1 pack/day smoker.  Hyponatremia, sodium 134 3 days ago.  Glucose intolerance.  A1c slightly elevated at 6.    PVD.  Note vascular pathology on 7/20 CT as described above.  1 ppd smoker.    PLAN     OK to go home.  Patient aware that if she has recurrent significant GI bleeding she needs to return to the emergency room.  No plans for office GI follow-up unless something else comes  up.  Needs follow-up CBC within 7 to 10 days of discharge.  This can be performed locally in [place].  Needs follow-up with PCP in [place], [facility] family practice, for follow-up of pulmonary nodule, peripheral vascular disease, anemia.    Continue to avoid NSAIDs and aspirin products as she has been doing since recent discharge.  Continue pantoprazole 40 mg daily for reflux symptoms, this has been very helpful for the patient.    Azucena Freed  05/28/2022, 9:33 AM Phone [phone]     Attending  physician's note   I have taken history, reviewed the chart and examined the patient. I performed a substantive portion of this encounter, including complete performance of at least one of the key components, in conjunction with the APP. I agree with the Advanced Practitioner's note, impression and recommendations.   No further bleeding. Hb stable.  OK to D/C home Avoid NSAIDs Can start iron supplements Recheck CBC in a week at PCP FU GI PRN D/W pt.   Carmell Austria, MD [facility] [phone]

## 2022-05-28 NOTE — Progress Notes (Signed)
Mobility Specialist Progress Note:   05/28/22 1139  Mobility  Activity Ambulated with assistance in hallway  Level of Assistance Independent after set-up  Assistive Device None  Distance Ambulated (ft) 550 ft  Activity Response Tolerated well  $Mobility charge 1 Mobility   Pt received in bed willing to participate in mobility. Complaints of back pain. Left in bed with call bell in reach and all needs met.   [facility] Ariel Bridges Mobility Specialist

## 2022-05-28 NOTE — Discharge Instructions (Signed)
Start aspirin 81 mg daily.  Atorvastatin 40 mg daily, adequate hydration at least 2 L/day -Daily walking to and past the point of discomfort

## 2022-05-28 NOTE — Discharge Summary (Signed)
Physician Discharge Summary   Patient: Ariel Bridges MRN: [id number] DOB: 1961-01-23  Admit date:     05/21/2022  Discharge date: 05/28/22  Discharge Physician: Oswald Hillock   PCP: Practice, [facility]   Recommendations at discharge:   Follow-up PCP in 10 days Check CBC in 10 days Follow-up vascular surgery in 1 month  Discharge Diagnoses: Principal Problem:   Acute lower GI bleeding Active Problems:   Tobacco abuse   Alcohol abuse   Essential hypertension   Acute blood loss anemia   Acute hyponatremia  Resolved Problems:   * No resolved hospital problems. *  Hospital Course: 61 year old female with history of recent hospitalization for acute lower GI bleed, diverticulosis, essential hypertension, chronic alcohol abuse who was admitted to [facility] on 05/21/2022 with suspected acute lower GI bleed after presenting from home to [facility] ED complaining of maroon appearing stool. -Patient with recent hospitalization  from 05/13/2022 - 05/15/22 with acute lower gastrointestinal bleed associated with acute blood loss anemia, during which time underwent colonoscopy with Dr. Bryan Lemma of [facility] gastro[facility]ology on 05/15/22, with this evaluation notable for nonbleeding 6 mm polyp in the transverse colon as well as nonbleeding 4 mm polyp in the sigmoid colon both of which were resected and removed.  Colonoscopy also notable for diverticulosis throughout the entire colon, without evidence of active bleed, while noting a single diverticulum in the sigmoid colon with some surrounding focal erythema, but in the absence of overt active bleed.  No EGD at that time.   In the ED hemoglobin was found to be 5.5.  Also found to have low sodium 119.  Patient also has significant use of beer.  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  Vascular surgery was consulted -Was started on Protonix gtt. which has been discontinued per GI -RBC tagged scan was done this morning, no active bleeding noted -Repeat colonoscopy on 05/26/2022 showed diverticulosis, no active bleeding -Patient's hemoglobin has remained stable, plan to discharge home -Patient will follow-up PCP in 10 days to check CBC as outpatient -Patient has been told to come back to the ED if she starts to bleed again.   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  Atorvastatin 40 mg daily, adequate hydration at least 2 L/day -Daily walking to and past the point of discomfort   Chest pain -Likely from hypertensive urgency as well as demand ischemia from active GI bleed -Troponin x2 obtained; 9, 23 -Chest pain has resolved at this time -EKG showed ST depression in leads V3 and V4 -We will continue to monitor. -We will likely need cardiac evaluation for coronary disease as outpatient when stable from GI perspective   Hyponatremia -Resolved -Likely from beer potomania    Hypomagnesemia -Magnesium is 1.6 -We will give 2 g of magnesium sulfate IV x1 before discharge   Hypokalemia -Replete   Alcohol abuse -Continue with CIWA protocol -Continue thiamine folate -No  signs and symptoms of alcohol withdrawal   Hypertension -We will discontinue HCTZ/lisinopril -Continue other home medications          Consultants: Gastroenterology Procedures performed: Colonoscopy Disposition: Home Diet recommendation:  Discharge Diet Orders (From admission, onward)     Start     Ordered   05/28/22 0000  Diet - low sodium heart healthy        05/28/22 1110           Regular diet DISCHARGE MEDICATION: Allergies as of 05/28/2022       Reactions   Latex Itching, Other (See Comments)   redness        Medication List     STOP taking these medications    BC HEADACHE POWDER PO   indomethacin 25 MG capsule Commonly known as: INDOCIN   lisinopril-hydrochlorothiazide 20-12.5 MG tablet Commonly known as: ZESTORETIC   Tylenol PM Extra Strength 25-500 MG Tabs tablet Generic drug: diphenhydramine-acetaminophen       TAKE these medications    acetaminophen 325 MG tablet Commonly known as: TYLENOL Take 2 tablets (650 mg total) by mouth every 6 (six) hours as needed for mild pain (or Fever >/= 101).   amLODipine 5 MG tablet Commonly known as: NORVASC Take 5 mg by mouth at bedtime.   aspirin EC 81 MG tablet Take 1 tablet (81 mg total) by mouth daily. Swallow whole.   atenolol 50 MG tablet Commonly known as: TENORMIN Take 50 mg by mouth at bedtime.   atorvastatin 40 MG tablet Commonly known as: Lipitor Take 1 tablet (40 mg total) by mouth daily.   cyclobenzaprine 10 MG tablet Commonly known as: FLEXERIL Take 10 mg by mouth at bedtime.   folic acid 1 MG tablet Commonly known as: FOLVITE Take 1 tablet (1 mg total) by mouth daily.   multivitamin with minerals Tabs tablet Take 1 tablet by mouth daily.   pantoprazole 40 MG tablet Commonly known as: PROTONIX Take 1 tablet (40 mg total) by mouth daily at 6 (six) AM.   thiamine 100 MG tablet Take 1 tablet (100 mg total) by mouth daily.   TUMS PO Take 1 tablet by mouth See admin instructions. Take 1 tablet 2-3 times a day as needed for acid reflux        Follow-up Information     Cherre Robins, MD Follow up  in 1 month(s).   Specialties: Vascular Surgery, Interventional Cardiology Why: The office will call the patient with an appointment Contact information: [place] [address] [phone]         Practice, [facility].   Why: Need to check cbc in 10 days Contact information: [address] [place] [address] [phone]                Discharge Exam: Danley Danker Weights   05/26/22 0526 05/27/22 0532 05/28/22 0451  Weight: 54.7 kg 54.7 kg 54.2 kg   General-appears in no acute distress Heart-S1-S2, regular, no murmur auscultated Lungs-clear to auscultation bilaterally, no wheezing or crackles auscultated Abdomen-soft, nontender, no organomegaly Extremities-no edema in the lower extremities Neuro-alert, oriented x3, no focal deficit noted  Condition at discharge: good  The results of significant diagnostics from this hospitalization (including imaging, microbiology, ancillary and laboratory) are listed below for reference.   Imaging Studies: VAS [place] ABI WITH/WO TBI  Result Date: 05/26/2022  LOWER EXTREMITY DOPPLER STUDY Patient Name:  CURTINA GRILLS  Date of Exam:   05/25/2022  Medical Rec #: [id number]         Accession #:    [id number] Date of Birth: 14-Apr-1961         Patient Gender: F Patient Age:   50 years Exam Location:  [facility] Procedure:      VAS [place] ABI WITH/WO TBI Referring Phys: Jamelle Haring --------------------------------------------------------------------------------  Indications: Claudication, and Significant aortoiliac disease. High Risk Factors: Hypertension, current smoker.  Comparison Study: No previous exam noted. Performing Technologist: Bobetta Lime BS RVT  Examination Guidelines: A complete evaluation includes at minimum, Doppler waveform signals and systolic blood pressure reading at the level of bilateral brachial, anterior tibial, and posterior tibial arteries, when vessel segments are accessible.  Bilateral testing is considered an integral part of a complete examination. Photoelectric Plethysmograph (PPG) waveforms and toe systolic pressure readings are included as required and additional duplex testing as needed. Limited examinations for reoccurring indications may be performed as noted.  ABI Findings: +---------+------------------+-----+----------+--------+ Right    Rt Pressure (mmHg)IndexWaveform  Comment  +---------+------------------+-----+----------+--------+ Brachial 204                    triphasic          +---------+------------------+-----+----------+--------+ PTA      97                0.48 monophasic         +---------+------------------+-----+----------+--------+ DP       99                0.49 monophasic         +---------+------------------+-----+----------+--------+ Great Toe                       Absent             +---------+------------------+-----+----------+--------+ +---------+------------------+-----+----------+--------+ Left     Lt Pressure (mmHg)IndexWaveform  Comment  +---------+------------------+-----+----------+--------+ Brachial 150                    biphasic  Abnormal +---------+------------------+-----+----------+--------+ PTA      123               0.60 monophasic         +---------+------------------+-----+----------+--------+ DP       102               0.50 monophasic         +---------+------------------+-----+----------+--------+ Great Toe                       Absent             +---------+------------------+-----+----------+--------+ +-------+-----------+-----------+------------+------------+ ABI/TBIToday's ABIToday's TBIPrevious ABIPrevious TBI +-------+-----------+-----------+------------+------------+ Right  0.49                                           +-------+-----------+-----------+------------+------------+ Left   0.60                                            +-------+-----------+-----------+------------+------------+ The left brachial arm pressure is much lower compared to the right and abnormal waveforms also noted on the left, which is indicative of probable proximal obstruction.  Summary: Right: Resting right ankle-brachial index indicates severe right lower extremity arterial disease. Absent great toe  waveform. Left: Resting left ankle-brachial index indicates moderate left lower extremity arterial disease. Absent great toe waveform. *See table(s) above for measurements and observations.  Electronically signed by Orlie Pollen on 05/26/2022 at 11:56:30 AM.    Final    NM GI Blood Loss  Result Date: 05/25/2022 CLINICAL DATA:  GI bleeding EXAM: NUCLEAR MEDICINE GASTROINTESTINAL BLEEDING SCAN TECHNIQUE: Sequential abdominal images were obtained following intravenous administration of Tc-68mlabeled red blood cells. RADIOPHARMACEUTICALS:  20.0 MCi Tc-947mertechnetate in-vitro labeled red cells. COMPARISON:  None Available. FINDINGS: No evidence of radiotracer extravasation into the gastrointestinal tract to localize reported bleeding through 2 hours of planar imaging. Expected blood pool and urinary tract activity. IMPRESSION: No evidence of radiotracer extravasation into the gastrointestinal tract to localize reported bleeding through 2 hours of planar imaging. Electronically Signed   By: AlDelanna Ahmadi.D.   On: 05/25/2022 12:55   DG CHEST PORT 1 VIEW  Result Date: 05/24/2022 CLINICAL DATA:  [age]ear old female with chest pain EXAM: PORTABLE CHEST 1 VIEW COMPARISON:  CTA 05/24/2022 FINDINGS: Prominent ascending aorta and tortuous descending aorta. Normal heart size. No focal consolidation, pleural effusion, or pneumothorax. Remote right clavicle fracture. No acute osseous abnormality. IMPRESSION: Prominent ascending aorta and tortuous descending aorta. If there is concern for thoracic aortic aneurysm or dissection, consider CTA of the chest for further  evaluation. Electronically Signed   By: TyPlacido Sou.D.   On: 05/24/2022 09:13   CT ANGIO GI BLEED  Result Date: 05/24/2022 CLINICAL DATA:  Lower GI bleed EXAM: CTA ABDOMEN AND PELVIS WITHOUT AND WITH CONTRAST TECHNIQUE: Multidetector CT imaging of the abdomen and pelvis was performed using the standard protocol during bolus administration of intravenous contrast. Multiplanar reconstructed images and MIPs were obtained and reviewed to evaluate the vascular anatomy. RADIATION DOSE REDUCTION: This exam was performed according to the departmental dose-optimization program which includes automated exposure control, adjustment of the mA and/or kV according to patient size and/or use of iterative reconstruction technique. CONTRAST:  10051mMNIPAQUE IOHEXOL 350 MG/ML SOLN COMPARISON:  None available FINDINGS: VASCULAR Aorta: Calcified and noncalcified atheromatous plaque seen throughout the abdominal aorta without aneurysmal dilatation or flow-limiting stenosis. A small displaced partially calcified linear structure is seen within the lumen of the distal infrarenal abdominal aorta, best seen on image 58 of series 6. It is suspicious for non flow limiting focal dissection. Celiac: Mild stenosis of the origin of the celiac artery. 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 Renals: Complete to near complete occlusion at the origin of the renal arteries bilaterally. IMA: Occluded at the origin. Opacification of distal branches indicative of retrograde collateral flow. Inflow: Mild to moderate diffuse stenosis of the left common iliac artery. Near complete occlusion at the origin of the left internal iliac artery. Moderate to severe multifocal stenosis of the distal left external iliac artery. Severe stenosis of the right common iliac artery. Near complete occlusion at the origin of the right internal iliac artery. Complete to near complete occlusion of the distal right external iliac artery.  Proximal Outflow: Moderate stenosis of the right common femoral artery. Moderate stenosis of the left common femoral artery. Mild diffuse stenosis of the visualized proximal left superficial femoral artery. Veins: Hepatic, portal, superior mesenteric veins are patent. Review of the MIP images confirms the above findings. NON-VASCULAR Lower chest: 4 mm left lower lobe pulmonary nodule (image 1, series 6). Visualized lung bases otherwise unremarkable. Hepatobiliary: No focal liver abnormality is seen. No gallstones, gallbladder wall thickening, or  biliary dilatation. Pancreas: Unremarkable. No pancreatic ductal dilatation or surrounding inflammatory changes. Spleen: Normal in size without focal abnormality. Adrenals/Urinary Tract: Adrenal glands are normal. Advanced atrophy of the right kidney. Irregular thinning of the cortex of the left kidney likely due to prior ischemic or infectious etiology. Kidneys, ureters, and bladder otherwise unremarkable. Stomach/Bowel: No active hemorrhage identified within the gastrointestinal tract. There is diffuse colonic diverticulosis. There is fat stranding adjacent to the proximal descending colon suspicious for acute diverticulitis. No evidence of perforation or abscess formation. Appendix is normal. Lymphatic: No enlarged abdominal or pelvic lymph nodes. Reproductive: Uterus and bilateral adnexa are unremarkable. Other: No abdominal wall hernia or abnormality. No abdominopelvic ascites. Musculoskeletal: Deformity of the distal sacrum suspicious for prior healed fracture. This is best seen on image 88 of series 16. No acute osseous abnormality. IMPRESSION: VASCULAR 1. No active gastrointestinal hemorrhage. 2. 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 3. 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 4. 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 5. Extensive arterial occlusive disease of the visualize  lower extremity arteries as discussed above. NON-VASCULAR 1. Diffuse colonic diverticulosis with stranding of the fat adjacent to the proximal descending colon which is suspicious for acute diverticulitis. No evidence of perforation or abscess formation. 2. 4 mm left lower lobe pulmonary nodule (1/6). No follow-up needed if patient is low-risk.This recommendation follows the consensus statement: Guidelines for Management of Incidental Pulmonary Nodules Detected on CT Images: From the Fleischner Society 2017; Radiology 2017; 284:228-243. These results will be called to the ordering clinician or representative by the Radiologist Assistant, and communication documented in the PACS or [employer]. Electronically Signed   By: Miachel Roux M.D.   On: 05/24/2022 08:38   CT HEAD WO CONTRAST (5MM)  Result Date: 05/13/2022 CLINICAL DATA:  Head trauma, abnormal mental status (Age 57-64y) EXAM: CT HEAD WITHOUT CONTRAST TECHNIQUE: Contiguous axial images were obtained from the base of the skull through the vertex without intravenous contrast. RADIATION DOSE REDUCTION: This exam was performed according to the departmental dose-optimization program which includes automated exposure control, adjustment of the mA and/or kV according to patient size and/or use of iterative reconstruction technique. COMPARISON:  02/01/2010 FINDINGS: Brain: No acute intracranial abnormality. Specifically, no hemorrhage, hydrocephalus, mass lesion, acute infarction, or significant intracranial injury. Vascular: No hyperdense vessel or unexpected calcification. Skull: No acute calvarial abnormality. Sinuses/Orbits: No acute findings Other: None IMPRESSION: Normal study for age. Electronically Signed   By: Rolm Baptise M.D.   On: 05/13/2022 23:48    Microbiology: No results found for this or any previous visit.  Labs: CBC: Recent Labs  Lab 05/21/22 2314 05/22/22 1034 05/22/22 1443 05/23/22 0731 05/23/22 1736 05/24/22 0244  05/24/22 1753 05/25/22 0335 05/25/22 1414 05/25/22 2135 05/26/22 0536 05/27/22 0611 05/28/22 0541  WBC 7.9 6.5   < > 7.9  --  7.8  --  8.6  --   --   --  9.8 10.8*  NEUTROABS 5.9 5.1  --   --   --   --   --   --   --   --   --   --   --   HGB 5.5* 8.4*   < > 8.1*   < > 6.3*   < > 8.5* 8.4* 9.7* 8.4* 8.5* 8.9*  HCT 15.6* 23.2*   < > 23.1*   < > 18.6*   < > 23.6* 25.0* 28.8* 24.8* 24.4* 27.0*  MCV 96.3 86.6   < > 88.2  --  91.6  --  83.7  --   --   --  88.4 90.9  PLT 301 289   < > 302  --  278  --  249  --   --   --  306 304   < > = values in this interval not displayed.   Basic Metabolic Panel: Recent Labs  Lab 05/22/22 1034 05/22/22 1555 05/23/22 0345 05/24/22 0244 05/24/22 0920 05/25/22 0335 05/26/22 0536 05/27/22 0611  NA 131* 132* 132* 134* 137 134*  --   --   K 3.9  --  3.7 3.3* 4.0 4.0  --   --   CL 99  --  104 105 104 102  --   --   CO2 22  --  22 22 21* 21*  --   --   GLUCOSE 91  --  89 98 94 89  --   --   BUN 11  --  7* '10 10 14  '$ --   --   CREATININE 0.82  --  0.82 0.73 0.74 0.80  --   --   CALCIUM 8.6*  --  8.3* 7.8* 8.1* 8.4*  --   --   MG  --  1.6*  --   --   --  1.5* 1.6* 1.6*   Liver Function Tests: Recent Labs  Lab 05/22/22 1034  AST 23  ALT 14  ALKPHOS 63  BILITOT 0.6  PROT 5.0*  ALBUMIN 2.8*   CBG: No results for input(s): "GLUCAP" in the last 168 hours.  Discharge time spent: greater than 30 minutes.  Signed: Oswald Hillock, MD [facility] 05/28/2022

## 2022-05-28 NOTE — Progress Notes (Signed)
Discharge instructions given. Patient verbalized understanding and all questions were answered.  ?

## 2022-05-28 NOTE — TOC Transition Note (Signed)
Transition of Care [facility]) - CM/SW Discharge Note   Patient Details  Name: Ariel Bridges MRN: [id number] Date of Birth: May 17, 1961  [facility] CM/SW Contact:  Zenon Mayo, RN Phone Number: 05/28/2022, 11:40 AM   Clinical Narrative:    Patient is for dc today, has no needs.           Patient Goals and CMS Choice        Discharge Placement                       Discharge Plan and Services                                     Social Determinants of Health (SDOH) Interventions     Readmission Risk Interventions     No data to display

## 2022-05-30 ENCOUNTER — Telehealth: Payer: Self-pay | Admitting: Vascular Surgery

## 2022-05-30 NOTE — Telephone Encounter (Signed)
From: Karoline Caldwell, Hershal Coria  Sent: 05/27/2022   8:52 AM EDT  To: [facility] Admin Pool; [facility] Charge Pool   Aorto occlusive disease. Please schedule follow up in office with Dr. Stanford Breed with ABIs

## 2022-06-01 DIAGNOSIS — K922 Gastrointestinal hemorrhage, unspecified: Secondary | ICD-10-CM | POA: Diagnosis not present

## 2022-06-06 ENCOUNTER — Other Ambulatory Visit: Payer: Self-pay | Admitting: *Deleted

## 2022-06-06 DIAGNOSIS — I700 Atherosclerosis of aorta: Secondary | ICD-10-CM

## 2022-06-18 NOTE — Progress Notes (Unsigned)
[facility] [place]  ASSESSMENT / PLAN: [name] is a 61 y.o. 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  Patient counseled patients with asymptomatic peripheral arterial disease or claudication have a 1-2% risk of developing chronic limb threatening ischemia, but a 15-30% risk of mortality in the next 5 years. Intervention should only be considered for medically optimized patients with disabling symptoms.   Recommend the following which can slow the progression of atherosclerosis and reduce the risk of major adverse cardiac / limb events:  Complete cessation from all tobacco products. Blood glucose control with goal A1c < 7%. Blood pressure control with goal blood pressure < 140/90 mmHg. Lipid reduction therapy with goal LDL-C <70 Aspirin '81mg'$  PO QD.  Atorvastatin 40-'80mg'$  PO QD (or other "high intensity" statin therapy). Daily walking to and past the point of discomfort. Patient counseled to keep a log of exercise distance. Adequate hydration (at least 2 liters / day) if patient's heart and kidney function is adequate.   CHIEF COMPLAINT: ***  HISTORY OF PRESENT ILLNESS: Ariel Bridges is a 61 y.o. female recently admitted to the hospital for LGIB. CT angiogram was performed which showed aortoiliac occlusive disease.   On my interview, the patient reports antecedent severe claudication symptoms which are bordering on disabling.  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  She notices some relief with positional changes, raising the question of spinal stenosis or pseudoclaudication.  She does report a fairly classic symptoms of cramping calf discomfort with walking however.  She does not have ischemic rest pain.  She does endorse some discomfort in her feet, but this is not relieved by dependency.  She has no ulcers about her feet.  06/19/22: ***  VASCULAR SURGICAL HISTORY: ***  VASCULAR RISK FACTORS: {FINDINGS; POSITIVE  NEGATIVE:[id number]} history of stroke / transient ischemic attack. {FINDINGS; POSITIVE NEGATIVE:[id number]} history of coronary artery disease. *** history of PCI. *** history of CABG.  {FINDINGS; POSITIVE NEGATIVE:[id number]} history of diabetes mellitus. Last A1c ***. {FINDINGS; POSITIVE NEGATIVE:[id number]} history of smoking. *** actively smoking. {FINDINGS; POSITIVE NEGATIVE:[id number]} history of hypertension. *** drug regimen with *** control. {FINDINGS; POSITIVE NEGATIVE:[id number]} history of chronic kidney disease.  Last GFR ***. CKD {stage:30421363}. {FINDINGS; POSITIVE NEGATIVE:[id number]} history of chronic obstructive pulmonary disease, treated with ***.  FUNCTIONAL STATUS: ECOG performance status: {findings; ecog performance status:31780} Ambulatory status: {TNHAmbulation:25868}  Past Medical History:  Diagnosis Date   Alcohol abuse    Colon polyps    Diverticulosis    Hypertension    Lower GI bleed    Smoker     Past Surgical History:  Procedure Laterality Date   CESAREAN SECTION     COLONOSCOPY WITH PROPOFOL N/A 05/15/2022   Procedure: COLONOSCOPY WITH PROPOFOL;  Surgeon: Lavena Bullion, DO;  Location: [facility];  Service: Gastroenterology;  Laterality: N/A;   COLONOSCOPY WITH PROPOFOL N/A 05/26/2022   Procedure: COLONOSCOPY WITH PROPOFOL;  Surgeon: Doran Stabler, MD;  Location: [facility];  Service: Gastroenterology;  Laterality: N/A;   POLYPECTOMY  05/15/2022   Procedure: POLYPECTOMY;  Surgeon: Lavena Bullion, DO;  Location: [facility];  Service: Gastroenterology;;    No family history on file.  Social History   Socioeconomic History   Marital status: Divorced    Spouse name: Not on file   Number of children: Not on file   Years of education: Not on file   Highest education level: Not on file  Occupational History   Not on file  Tobacco Use   Smoking status: Every Day    Types: Cigarettes    Passive exposure: Current   Smokeless  tobacco: Never  Vaping Use   Vaping Use: Not on file  Substance and Sexual Activity   Alcohol use: Yes    Comment: daily   Drug use: Never   Sexual activity: Not Currently  Other Topics Concern   Not on file  Social History Narrative   Not on file   Social Determinants of Health   Financial Resource Strain: Not on file  Food Insecurity: Not on file  Transportation Needs: Not on file  Physical Activity: Not on file  Stress: Not on file  Social Connections: Not on file  Intimate Partner Violence: Not on file    Allergies  Allergen Reactions   Latex Itching and Other (See Comments)    redness    Current Outpatient Medications  Medication Sig Dispense Refill   acetaminophen (TYLENOL) 325 MG tablet Take 2 tablets (650 mg total) by mouth every 6 (six) hours as needed for mild pain (or Fever >/= 101).     amLODipine (NORVASC) 5 MG tablet Take 5 mg by mouth at bedtime.     aspirin EC 81 MG tablet Take 1 tablet (81 mg total) by mouth daily. Swallow whole. 30 tablet 3   atenolol (TENORMIN) 50 MG tablet Take 50 mg by mouth at bedtime.     atorvastatin (LIPITOR) 40 MG tablet Take 1 tablet (40 mg total) by mouth daily. 30 tablet 11   Calcium Carbonate Antacid (TUMS PO) Take 1 tablet by mouth See admin instructions. Take 1 tablet 2-3 times a day as needed for acid reflux     cyclobenzaprine (FLEXERIL) 10 MG tablet Take 10 mg by mouth at bedtime.     folic acid (FOLVITE) 1 MG tablet Take 1 tablet (1 mg total) by mouth daily.     Multiple Vitamin (MULTIVITAMIN WITH MINERALS) TABS tablet Take 1 tablet by mouth daily.     pantoprazole (PROTONIX) 40 MG tablet Take 1 tablet (40 mg total) by mouth daily at 6 (six) AM. 30 tablet 1   thiamine 100 MG tablet Take 1 tablet (100 mg total) by mouth daily.     No current facility-administered medications for this visit.    PHYSICAL EXAM There were no vitals filed for this visit.  Constitutional: *** appearing. *** distress. Appears ***  nourished.  Neurologic: CN ***. *** focal findings. *** sensory loss. Psychiatric: *** Mood and affect symmetric and appropriate. Eyes: *** No icterus. No conjunctival pallor. Ears, nose, throat: *** mucous membranes moist. Midline trachea.  Cardiac: *** rate and rhythm.  Respiratory: *** unlabored. Abdominal: *** soft, non-tender, non-distended.  Peripheral vascular: *** Extremity: *** edema. *** cyanosis. *** pallor.  Skin: *** gangrene. *** ulceration.  Lymphatic: *** Stemmer's sign. *** palpable lymphadenopathy.    PERTINENT LABORATORY AND RADIOLOGIC DATA  Most recent CBC    Latest Ref Rng & Units 05/28/2022    5:41 AM 05/27/2022    6:11 AM 05/26/2022    5:36 AM  CBC  WBC 4.0 - 10.5 K/uL 10.8  9.8    Hemoglobin 12.0 - 15.0 g/dL 8.9  8.5  8.4   Hematocrit 36.0 - 46.0 % 27.0  24.4  24.8   Platelets 150 - 400 K/uL 304  306       Most recent CMP    Latest Ref Rng & Units 05/25/2022    3:35 AM 05/24/2022    9:20  AM 05/24/2022    2:44 AM  CMP  Glucose 70 - 99 mg/dL 89  94  98   BUN 8 - 23 mg/dL '14  10  10   '$ Creatinine 0.44 - 1.00 mg/dL 0.80  0.74  0.73   Sodium 135 - 145 mmol/L 134  137  134   Potassium 3.5 - 5.1 mmol/L 4.0  4.0  3.3   Chloride 98 - 111 mmol/L 102  104  105   CO2 22 - 32 mmol/L '21  21  22   '$ Calcium 8.9 - 10.3 mg/dL 8.4  8.1  7.8     Renal function CrCl cannot be calculated (Patient's most recent lab result is older than the maximum 21 days allowed.).  Hgb A1c MFr Bld (%)  Date Value  05/13/2022 6.0 (H)    No results found for: "LDLCALC", "LDLC", "HIRISKLDL", "POCLDL", "LDLDIRECT", "REALLDLC", "TOTLDLC"   Vascular Imaging: ***  Charlye Spare N. Stanford Breed, MD [facility] Phone Number: [phone] 06/18/2022 2:30 PM  Total time spent on preparing this encounter including chart review, data review, collecting history, examining the patient, coordinating care for this {tnhtimebilling:26202}  Portions of this report  may have been transcribed using voice recognition software.  Every effort has been made to ensure accuracy; however, inadvertent computerized transcription errors may still be present.

## 2022-06-19 ENCOUNTER — Encounter: Payer: Self-pay | Admitting: Vascular Surgery

## 2022-06-19 ENCOUNTER — Encounter (HOSPITAL_COMMUNITY): Payer: No Typology Code available for payment source

## 2022-06-19 ENCOUNTER — Ambulatory Visit (INDEPENDENT_AMBULATORY_CARE_PROVIDER_SITE_OTHER): Payer: 59 | Admitting: Vascular Surgery

## 2022-06-19 VITALS — BP 195/105 | HR 67 | Temp 98.2°F | Resp 20 | Ht 65.0 in | Wt 123.0 lb

## 2022-06-19 DIAGNOSIS — I7409 Other arterial embolism and thrombosis of abdominal aorta: Secondary | ICD-10-CM | POA: Diagnosis not present

## 2022-06-21 ENCOUNTER — Telehealth: Payer: Self-pay | Admitting: Licensed Clinical Social Worker

## 2022-06-21 NOTE — Telephone Encounter (Signed)
CSW received request to contact patient to discuss some financial options as patient reported her current health insurance is not covering her bills. Patient states she works in [employer] and has been on a reduced scheduled due to current health issues. She states that her boss has been kind enough to give her 40 hours pay but she realizes that will not last much longer. She states that she purchased health insurance through the marketplace and the coverage does not cover all her current medical bills. Patient is feeling very overwhelmed with financial concerns and overdue medical bills.   CSW provided support and suggested patient gather all her medical bills and go to [employer] to apply for Medicaid to see if she could get some additional coverage and financial assistance. Patient also states she spoke with financial counseling and they will mail her an application for [employer] . CSW advised to explore the Medicaid option first and then follow up with [employer] assistance if not able to apply for medicaid program.   Patient verbalizes understanding and will follow up and return call to CSW as needed. CSW available as needed. Raquel Sarna, [place], [place]

## 2022-06-22 ENCOUNTER — Other Ambulatory Visit: Payer: Self-pay

## 2022-06-22 DIAGNOSIS — I739 Peripheral vascular disease, unspecified: Secondary | ICD-10-CM

## 2022-06-27 ENCOUNTER — Ambulatory Visit: Payer: No Typology Code available for payment source | Admitting: Gastroenterology

## 2022-07-16 DIAGNOSIS — I7409 Other arterial embolism and thrombosis of abdominal aorta: Secondary | ICD-10-CM | POA: Diagnosis not present

## 2022-07-16 DIAGNOSIS — M79605 Pain in left leg: Secondary | ICD-10-CM | POA: Diagnosis not present

## 2022-07-16 DIAGNOSIS — I10 Essential (primary) hypertension: Secondary | ICD-10-CM | POA: Diagnosis not present

## 2022-07-16 DIAGNOSIS — K219 Gastro-esophageal reflux disease without esophagitis: Secondary | ICD-10-CM | POA: Diagnosis not present

## 2022-07-16 DIAGNOSIS — K922 Gastrointestinal hemorrhage, unspecified: Secondary | ICD-10-CM | POA: Diagnosis not present

## 2022-07-16 DIAGNOSIS — M79604 Pain in right leg: Secondary | ICD-10-CM | POA: Diagnosis not present

## 2022-09-17 NOTE — Progress Notes (Unsigned)
[facility] [place]  ASSESSMENT / PLAN: ATHALEE ESTERLINE is a 61 y.o. female with aortoiliac occlusive disaese causing disabling claudication verging on rest pain.  Patient counseled patients with asymptomatic peripheral arterial disease or claudication have a 1-2% risk of developing chronic limb threatening ischemia, but a 15-30% risk of mortality in the next 5 years. Intervention should only be considered for medically optimized patients with disabling symptoms.   Recommend the following which can slow the progression of atherosclerosis and reduce the risk of major adverse cardiac / limb events:  Complete cessation from all tobacco products. Blood glucose control with goal A1c < 7%. Blood pressure control with goal blood pressure < 140/90 mmHg. Lipid reduction therapy with goal LDL-C <70 Aspirin '81mg'$  PO QD.  Atorvastatin 40-'80mg'$  PO QD (or other "high intensity" statin therapy). Daily walking to and past the point of discomfort. Patient counseled to keep a log of exercise distance. Adequate hydration (at least 2 liters / day) if patient's heart and kidney function is adequate.  The patient is in a difficult position both clinically and socially.  Counseled her extensively that she must stop smoking cigarettes.  I would like her seen by a cardiologist for her extreme hypertension, and to assist with lipid management. She may be a candidate for [facility].  He has significant financial concerns and seems to be under insured.  We will reach out to our social workers to help her afford her care.  I will see her again in 3 months with repeat ABI.  CHIEF COMPLAINT: Pain with walking  HISTORY OF PRESENT ILLNESS: BRITNE BORELLI is a 61 y.o. female recently admitted to the hospital for LGIB. CT angiogram was performed which showed aortoiliac occlusive disease.   At that time, the patient reports antecedent severe claudication symptoms which are bordering on disabling.  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  She notices some relief with positional changes, raising the question of spinal stenosis or pseudoclaudication.  She does report a fairly classic symptoms of cramping calf discomfort with walking however.  She does not have ischemic rest pain.  She does endorse some discomfort in her feet, but this is not relieved by dependency.  She has no ulcers about her feet.  06/19/22: Patient returns to clinic for follow-up evaluation.  Patient has returned to cigarette smoking, unfortunately.  She reports disabling claudication symptoms.  She is able to get through work with extreme pain.  She avoids hobbies that she used to enjoy such as gardening because of pain.  She does describe some early rest pain features (e.g. pain in the feet waking her up at night), but reports these to be mild and only occur occasionally.  She does not have any ulceration of her feet.  VASCULAR SURGICAL HISTORY: none   VASCULAR RISK FACTORS: Negative history of stroke / transient ischemic attack. Negative history of coronary artery disease.  Negative history of diabetes mellitus.  Positive history of smoking. + actively smoking. Positive history of hypertension.  Negative history of chronic kidney disease.   Negative history of chronic obstructive pulmonary disease.  FUNCTIONAL STATUS: ECOG performance status: (1) Restricted in physically strenuous activity, ambulatory and able to do work of light nature Ambulatory status: Ambulatory within the community with limits  Past Medical History:  Diagnosis Date   Alcohol abuse    Colon polyps    Diverticulosis    Hypertension    Lower GI bleed    Smoker  Past Surgical History:  Procedure Laterality Date   CESAREAN SECTION     COLONOSCOPY WITH PROPOFOL N/A 05/15/2022   Procedure: COLONOSCOPY WITH PROPOFOL;  Surgeon: Lavena Bullion, DO;  Location: [facility];  Service: Gastroenterology;  Laterality: N/A;   COLONOSCOPY WITH PROPOFOL N/A  05/26/2022   Procedure: COLONOSCOPY WITH PROPOFOL;  Surgeon: Doran Stabler, MD;  Location: [facility];  Service: Gastroenterology;  Laterality: N/A;   POLYPECTOMY  05/15/2022   Procedure: POLYPECTOMY;  Surgeon: Lavena Bullion, DO;  Location: [facility];  Service: Gastroenterology;;    No family history on file.  Social History   Socioeconomic History   Marital status: Divorced    Spouse name: Not on file   Number of children: Not on file   Years of education: Not on file   Highest education level: Not on file  Occupational History   Not on file  Tobacco Use   Smoking status: Every Day    Packs/day: 1.00    Types: Cigarettes    Passive exposure: Current   Smokeless tobacco: Never  Vaping Use   Vaping Use: Not on file  Substance and Sexual Activity   Alcohol use: Yes    Comment: daily   Drug use: Never   Sexual activity: Not Currently  Other Topics Concern   Not on file  Social History Narrative   Not on file   Social Determinants of Health   Financial Resource Strain: High Risk (06/21/2022)   Overall Financial Resource Strain (CARDIA)    Difficulty of Paying Living Expenses: Hard  Food Insecurity: Not on file  Transportation Needs: Not on file  Physical Activity: Not on file  Stress: Not on file  Social Connections: Not on file  Intimate Partner Violence: Not on file    Allergies  Allergen Reactions   Latex Itching and Other (See Comments)    redness    Current Outpatient Medications  Medication Sig Dispense Refill   acetaminophen (TYLENOL) 325 MG tablet Take 2 tablets (650 mg total) by mouth every 6 (six) hours as needed for mild pain (or Fever >/= 101).     amLODipine (NORVASC) 5 MG tablet Take 5 mg by mouth at bedtime.     aspirin EC 81 MG tablet Take 1 tablet (81 mg total) by mouth daily. Swallow whole. 30 tablet 3   atenolol (TENORMIN) 50 MG tablet Take 50 mg by mouth at bedtime.     atorvastatin (LIPITOR) 40 MG tablet Take 1 tablet (40 mg  total) by mouth daily. 30 tablet 11   Calcium Carbonate Antacid (TUMS PO) Take 1 tablet by mouth See admin instructions. Take 1 tablet 2-3 times a day as needed for acid reflux     cyclobenzaprine (FLEXERIL) 10 MG tablet Take 10 mg by mouth at bedtime.     folic acid (FOLVITE) 1 MG tablet Take 1 tablet (1 mg total) by mouth daily.     lisinopril (ZESTRIL) 20 MG tablet Take 20 mg by mouth daily.     Multiple Vitamin (MULTIVITAMIN WITH MINERALS) TABS tablet Take 1 tablet by mouth daily.     pantoprazole (PROTONIX) 40 MG tablet Take 1 tablet (40 mg total) by mouth daily at 6 (six) AM. 30 tablet 1   thiamine 100 MG tablet Take 1 tablet (100 mg total) by mouth daily.     No current facility-administered medications for this visit.    PHYSICAL EXAM There were no vitals filed for this visit.  Constitutional: Chronically ill,  appearing older than stated age. Cardiac:  rate and rhythm.  Respiratory:  unlabored. Peripheral vascular: No palpable pedal pulses  PERTINENT LABORATORY AND RADIOLOGIC DATA  Most recent CBC    Latest Ref Rng & Units 05/28/2022    5:41 AM 05/27/2022    6:11 AM 05/26/2022    5:36 AM  CBC  WBC 4.0 - 10.5 K/uL 10.8  9.8    Hemoglobin 12.0 - 15.0 g/dL 8.9  8.5  8.4   Hematocrit 36.0 - 46.0 % 27.0  24.4  24.8   Platelets 150 - 400 K/uL 304  306       Most recent CMP    Latest Ref Rng & Units 05/25/2022    3:35 AM 05/24/2022    9:20 AM 05/24/2022    2:44 AM  CMP  Glucose 70 - 99 mg/dL 89  94  98   BUN 8 - 23 mg/dL '14  10  10   '$ Creatinine 0.44 - 1.00 mg/dL 0.80  0.74  0.73   Sodium 135 - 145 mmol/L 134  137  134   Potassium 3.5 - 5.1 mmol/L 4.0  4.0  3.3   Chloride 98 - 111 mmol/L 102  104  105   CO2 22 - 32 mmol/L '21  21  22   '$ Calcium 8.9 - 10.3 mg/dL 8.4  8.1  7.8     Hgb A1c MFr Bld (%)  Date Value  05/13/2022 6.0 (H)   CT angiogram 05/24/2022.  Personally reviewed.  Significant atherosclerotic burden.  She has bilateral renal artery atherosclerosis.  She  has significant iliac stenosis bilaterally.  She has common femoral disease bilaterally causing significant stenosis.  Yevonne Aline. Stanford Breed, MD [facility] Phone Number: [phone] 09/17/2022 8:51 PM  Total time spent on preparing this encounter including chart review, data review, collecting history, examining the patient, coordinating care for this established patient, 30 minutes.  Portions of this report may have been transcribed using voice recognition software.  Every effort has been made to ensure accuracy; however, inadvertent computerized transcription errors may still be present.

## 2022-09-18 ENCOUNTER — Ambulatory Visit (HOSPITAL_COMMUNITY)
Admission: RE | Admit: 2022-09-18 | Discharge: 2022-09-18 | Disposition: A | Discharge status: Home / Self Care | Payer: 59 | Source: Ambulatory Visit | Attending: Vascular Surgery | Admitting: Vascular Surgery

## 2022-09-18 ENCOUNTER — Ambulatory Visit: Payer: 59 | Admitting: Vascular Surgery

## 2022-09-18 ENCOUNTER — Encounter: Payer: Self-pay | Admitting: Vascular Surgery

## 2022-09-18 VITALS — BP 157/91 | HR 60 | Temp 98.0°F | Resp 20 | Ht 65.0 in | Wt 132.5 lb

## 2022-09-18 DIAGNOSIS — I739 Peripheral vascular disease, unspecified: Secondary | ICD-10-CM | POA: Insufficient documentation

## 2022-09-18 DIAGNOSIS — I7409 Other arterial embolism and thrombosis of abdominal aorta: Secondary | ICD-10-CM

## 2022-09-21 ENCOUNTER — Other Ambulatory Visit: Payer: Self-pay

## 2022-09-21 DIAGNOSIS — I739 Peripheral vascular disease, unspecified: Secondary | ICD-10-CM

## 2022-10-30 DIAGNOSIS — I7409 Other arterial embolism and thrombosis of abdominal aorta: Secondary | ICD-10-CM | POA: Diagnosis not present

## 2022-10-30 DIAGNOSIS — Z23 Encounter for immunization: Secondary | ICD-10-CM | POA: Diagnosis not present

## 2022-10-30 DIAGNOSIS — K219 Gastro-esophageal reflux disease without esophagitis: Secondary | ICD-10-CM | POA: Diagnosis not present

## 2022-10-30 DIAGNOSIS — R531 Weakness: Secondary | ICD-10-CM | POA: Diagnosis not present

## 2022-10-30 DIAGNOSIS — I10 Essential (primary) hypertension: Secondary | ICD-10-CM | POA: Diagnosis not present

## 2022-12-18 ENCOUNTER — Ambulatory Visit: Payer: 59 | Admitting: Vascular Surgery

## 2022-12-18 ENCOUNTER — Ambulatory Visit (HOSPITAL_COMMUNITY): Payer: 59

## 2023-02-20 DIAGNOSIS — N183 Chronic kidney disease, stage 3 unspecified: Secondary | ICD-10-CM | POA: Diagnosis not present

## 2023-02-20 DIAGNOSIS — I7409 Other arterial embolism and thrombosis of abdominal aorta: Secondary | ICD-10-CM | POA: Diagnosis not present

## 2023-02-20 DIAGNOSIS — Z1331 Encounter for screening for depression: Secondary | ICD-10-CM | POA: Diagnosis not present

## 2023-02-20 DIAGNOSIS — I10 Essential (primary) hypertension: Secondary | ICD-10-CM | POA: Diagnosis not present

## 2023-02-20 DIAGNOSIS — E7800 Pure hypercholesterolemia, unspecified: Secondary | ICD-10-CM | POA: Diagnosis not present

## 2023-02-20 DIAGNOSIS — Z87891 Personal history of nicotine dependence: Secondary | ICD-10-CM | POA: Diagnosis not present

## 2023-02-20 DIAGNOSIS — M5416 Radiculopathy, lumbar region: Secondary | ICD-10-CM | POA: Diagnosis not present

## 2023-03-01 ENCOUNTER — Telehealth: Payer: Self-pay

## 2023-03-05 NOTE — Telephone Encounter (Signed)
Appt has been rescheduled

## 2023-03-12 ENCOUNTER — Encounter (HOSPITAL_COMMUNITY): Payer: 59

## 2023-03-12 ENCOUNTER — Ambulatory Visit: Payer: 59 | Admitting: Vascular Surgery

## 2023-04-09 ENCOUNTER — Encounter (HOSPITAL_COMMUNITY): Payer: 59

## 2023-04-09 ENCOUNTER — Ambulatory Visit: Payer: 59 | Admitting: Vascular Surgery

## 2023-04-30 ENCOUNTER — Ambulatory Visit (INDEPENDENT_AMBULATORY_CARE_PROVIDER_SITE_OTHER): Payer: 59 | Admitting: Vascular Surgery

## 2023-04-30 ENCOUNTER — Encounter: Payer: Self-pay | Admitting: Vascular Surgery

## 2023-04-30 ENCOUNTER — Ambulatory Visit (HOSPITAL_COMMUNITY)
Admission: RE | Admit: 2023-04-30 | Discharge: 2023-04-30 | Disposition: A | Discharge status: Home / Self Care | Payer: 59 | Source: Ambulatory Visit | Attending: Vascular Surgery | Admitting: Vascular Surgery

## 2023-04-30 VITALS — BP 178/89 | HR 61 | Temp 98.2°F | Resp 20 | Ht 65.0 in | Wt 133.0 lb

## 2023-04-30 DIAGNOSIS — I739 Peripheral vascular disease, unspecified: Secondary | ICD-10-CM | POA: Insufficient documentation

## 2023-04-30 DIAGNOSIS — I7409 Other arterial embolism and thrombosis of abdominal aorta: Secondary | ICD-10-CM

## 2023-04-30 LAB — VAS US ABI WITH/WO TBI
Left ABI: 0.39
Right ABI: 0.45

## 2023-04-30 NOTE — Progress Notes (Unsigned)
[facility] [place]  ASSESSMENT / PLAN: Ariel Bridges is a 62 y.o. female with aortoiliac occlusive disaese causing disabling claudication verging on rest pain.  Patient counseled patients with asymptomatic peripheral arterial disease or claudication have a 1-2% risk of developing chronic limb threatening ischemia, but a 15-30% risk of mortality in the next 5 years. Intervention should only be considered for medically optimized patients with disabling symptoms.   Recommend the following which can slow the progression of atherosclerosis and reduce the risk of major adverse cardiac / limb events:  Complete cessation from all tobacco products. Blood glucose control with goal A1c < 7%. Blood pressure control with goal blood pressure < 140/90 mmHg. Lipid reduction therapy with goal LDL-C <70 Aspirin 81mg  PO QD.  Atorvastatin 40-80mg  PO QD (or other "high intensity" statin therapy). Daily walking to and past the point of discomfort. Patient counseled to keep a log of exercise distance. Adequate hydration (at least 2 liters / day) if patient's heart and kidney function is adequate.  Counseled her extensively that she must stop smoking cigarettes. She does not have any limb threatening symptoms at the moment, but I am worried she will deteriorate to CLTI. I will see her again in 6 months with repeat ABI.  CHIEF COMPLAINT: Pain with walking  HISTORY OF PRESENT ILLNESS: TEONIA Bridges is a 62 y.o. female recently admitted to the hospital for LGIB. CT angiogram was performed which showed aortoiliac occlusive disease.   At that time, the patient reports antecedent severe claudication symptoms which are bordering on disabling.  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  She notices some relief with positional changes, raising the question of spinal stenosis or pseudoclaudication.  She does report a fairly classic symptoms of cramping calf discomfort with walking however.  She does  not have ischemic rest pain.  She does endorse some discomfort in her feet, but this is not relieved by dependency.  She has no ulcers about her feet.  06/19/22: Patient returns to clinic for follow-up evaluation.  Patient has returned to cigarette smoking, unfortunately.  She reports disabling claudication symptoms.  She is able to get through work with extreme pain.  She avoids hobbies that she used to enjoy such as gardening because of pain.  She does describe some early rest pain features (e.g. pain in the feet waking her up at night), but reports these to be mild and only occur occasionally.  She does not have any ulceration of her feet.  09/18/22: Patient returns for surveillance.  She reports her symptoms are stable.  She is able to work, but with difficulty.  She does not describe classic symptoms of rest pain.  She is taking gabapentin at night which may be masking some symptoms.  She has no ulcers about her feet  04/30/23: Patient returns to clinic. She reports her symptoms are about the same. She is still able to work. She has a hard time walking to work and around her workspace. She is understanding she will likely need surgery for this problem, but does not want to take the time off necessary to recover.   VASCULAR SURGICAL HISTORY: none   VASCULAR RISK FACTORS: Negative history of stroke / transient ischemic attack. Negative history of coronary artery disease.  Negative history of diabetes mellitus.  Positive history of smoking. + actively smoking. Positive history of hypertension.  Negative history of chronic kidney disease.   Negative history of chronic obstructive pulmonary disease.  FUNCTIONAL STATUS: ECOG performance status: (1) Restricted in physically strenuous activity, ambulatory and able to do work of light nature Ambulatory status: Ambulatory within the community with limits  Past Medical History:  Diagnosis Date   Alcohol abuse    Colon polyps    Diverticulosis     Hypertension    Lower GI bleed    Smoker     Past Surgical History:  Procedure Laterality Date   CESAREAN SECTION     COLONOSCOPY WITH PROPOFOL N/A 05/15/2022   Procedure: COLONOSCOPY WITH PROPOFOL;  Surgeon: Shellia Cleverly, DO;  Location: [facility] ENDOSCOPY;  Service: Gastroenterology;  Laterality: N/A;   COLONOSCOPY WITH PROPOFOL N/A 05/26/2022   Procedure: COLONOSCOPY WITH PROPOFOL;  Surgeon: Sherrilyn Rist, MD;  Location: [facility] ENDOSCOPY;  Service: Gastroenterology;  Laterality: N/A;   POLYPECTOMY  05/15/2022   Procedure: POLYPECTOMY;  Surgeon: Shellia Cleverly, DO;  Location: [facility] ENDOSCOPY;  Service: Gastroenterology;;    History reviewed. No pertinent family history.  Social History   Socioeconomic History   Marital status: Divorced    Spouse name: Not on file   Number of children: Not on file   Years of education: Not on file   Highest education level: Not on file  Occupational History   Not on file  Tobacco Use   Smoking status: Every Day    Packs/day: 1    Types: Cigarettes    Passive exposure: Current   Smokeless tobacco: Never  Vaping Use   Vaping Use: Not on file  Substance and Sexual Activity   Alcohol use: Yes    Comment: daily   Drug use: Never   Sexual activity: Not Currently  Other Topics Concern   Not on file  Social History Narrative   Not on file   Social Determinants of Health   Financial Resource Strain: High Risk (06/21/2022)   Overall Financial Resource Strain (CARDIA)    Difficulty of Paying Living Expenses: Hard  Food Insecurity: Not on file  Transportation Needs: Not on file  Physical Activity: Not on file  Stress: Not on file  Social Connections: Not on file  Intimate Partner Violence: Not on file    Allergies  Allergen Reactions   Latex Itching and Other (See Comments)    redness    Current Outpatient Medications  Medication Sig Dispense Refill   acetaminophen (TYLENOL) 325 MG tablet Take 2 tablets (650 mg total) by mouth  every 6 (six) hours as needed for mild pain (or Fever >/= 101).     amLODipine (NORVASC) 5 MG tablet Take 5 mg by mouth at bedtime.     aspirin EC 81 MG tablet Take 1 tablet (81 mg total) by mouth daily. Swallow whole. 30 tablet 3   atenolol (TENORMIN) 50 MG tablet Take 50 mg by mouth at bedtime.     atorvastatin (LIPITOR) 40 MG tablet Take 1 tablet (40 mg total) by mouth daily. 30 tablet 11   Calcium Carbonate Antacid (TUMS PO) Take 1 tablet by mouth See admin instructions. Take 1 tablet 2-3 times a day as needed for acid reflux     cyclobenzaprine (FLEXERIL) 10 MG tablet Take 10 mg by mouth at bedtime.     folic acid (FOLVITE) 1 MG tablet Take 1 tablet (1 mg total) by mouth daily.     gabapentin (NEURONTIN) 300 MG capsule Take 300 mg by mouth 2 (two) times daily as needed.     lisinopril-hydrochlorothiazide (ZESTORETIC) 20-12.5 MG tablet Take 1 tablet by  mouth 2 (two) times daily.     Multiple Vitamin (MULTIVITAMIN WITH MINERALS) TABS tablet Take 1 tablet by mouth daily.     pantoprazole (PROTONIX) 40 MG tablet Take 1 tablet (40 mg total) by mouth daily at 6 (six) AM. 30 tablet 1   thiamine 100 MG tablet Take 1 tablet (100 mg total) by mouth daily.     No current facility-administered medications for this visit.    PHYSICAL EXAM Vitals:   04/30/23 1531  BP: (!) 178/89  Pulse: 61  Resp: 20  Temp: 98.2 F (36.8 C)  SpO2: 100%  Weight: 133 lb (60.3 kg)  Height: 5\' 5"  (1.651 m)    Constitutional: Chronically ill, appearing older than stated age. Cardiac:  rate and rhythm.  Respiratory:  unlabored. Peripheral vascular: No palpable pedal pulses  PERTINENT LABORATORY AND RADIOLOGIC DATA  Most recent CBC    Latest Ref Rng & Units 05/28/2022    5:41 AM 05/27/2022    6:11 AM 05/26/2022    5:36 AM  CBC  WBC 4.0 - 10.5 K/uL 10.8  9.8    Hemoglobin 12.0 - 15.0 g/dL 8.9  8.5  8.4   Hematocrit 36.0 - 46.0 % 27.0  24.4  24.8   Platelets 150 - 400 K/uL 304  306       Most recent  CMP    Latest Ref Rng & Units 05/25/2022    3:35 AM 05/24/2022    9:20 AM 05/24/2022    2:44 AM  CMP  Glucose 70 - 99 mg/dL 89  94  98   BUN 8 - 23 mg/dL 14  10  10    Creatinine 0.44 - 1.00 mg/dL 1.61  0.96  0.45   Sodium 135 - 145 mmol/L 134  137  134   Potassium 3.5 - 5.1 mmol/L 4.0  4.0  3.3   Chloride 98 - 111 mmol/L 102  104  105   CO2 22 - 32 mmol/L 21  21  22    Calcium 8.9 - 10.3 mg/dL 8.4  8.1  7.8     Hgb W0J MFr Bld (%)  Date Value  05/13/2022 6.0 (H)     +-------+-----------+-----------+------------+------------+  ABI/TBIToday's ABIToday's TBIPrevious ABIPrevious TBI  +-------+-----------+-----------+------------+------------+  Right 0.45       0.3        0.33        0             +-------+-----------+-----------+------------+------------+  Left  0.39       0.18       0.53        0             +-------+-----------+-----------+------------+------------+    Rande Brunt. Lenell Antu, MD [facility] [place] Phone Number: [phone] 04/30/2023 4:46 PM  Total time spent on preparing this encounter including chart review, data review, collecting history, examining the patient, coordinating care for this established patient, 30 minutes.  Portions of this report may have been transcribed using voice recognition software.  Every effort has been made to ensure accuracy; however, inadvertent computerized transcription errors may still be present.

## 2023-05-03 ENCOUNTER — Telehealth: Payer: Self-pay

## 2023-05-03 NOTE — Patient Outreach (Signed)
  Care Coordination   05/03/2023 Name: Ariel Bridges MRN: [id number] DOB: Sep 11, 1961   Care Coordination Outreach Attempts:  An unsuccessful telephone outreach was attempted today to offer the patient information about available care coordination services.  Follow Up Plan:  Additional outreach attempts will be made to offer the patient care coordination information and services.   Encounter Outcome:  No Answer   Care Coordination Interventions:  No, not indicated    Rowe Pavy, RN, BSN, [facility] [employer] [phone]

## 2023-05-08 ENCOUNTER — Other Ambulatory Visit: Payer: Self-pay

## 2023-05-08 DIAGNOSIS — I739 Peripheral vascular disease, unspecified: Secondary | ICD-10-CM

## 2023-08-23 DIAGNOSIS — I10 Essential (primary) hypertension: Secondary | ICD-10-CM | POA: Diagnosis not present

## 2023-08-23 DIAGNOSIS — Z23 Encounter for immunization: Secondary | ICD-10-CM | POA: Diagnosis not present

## 2023-08-23 DIAGNOSIS — I7409 Other arterial embolism and thrombosis of abdominal aorta: Secondary | ICD-10-CM | POA: Diagnosis not present

## 2023-08-23 DIAGNOSIS — E7800 Pure hypercholesterolemia, unspecified: Secondary | ICD-10-CM | POA: Diagnosis not present

## 2023-08-23 DIAGNOSIS — N183 Chronic kidney disease, stage 3 unspecified: Secondary | ICD-10-CM | POA: Diagnosis not present

## 2023-10-09 DIAGNOSIS — I739 Peripheral vascular disease, unspecified: Secondary | ICD-10-CM | POA: Diagnosis not present

## 2023-10-09 DIAGNOSIS — E7800 Pure hypercholesterolemia, unspecified: Secondary | ICD-10-CM | POA: Diagnosis not present

## 2023-10-09 DIAGNOSIS — I10 Essential (primary) hypertension: Secondary | ICD-10-CM | POA: Diagnosis not present

## 2023-11-19 ENCOUNTER — Ambulatory Visit: Payer: 59 | Admitting: Vascular Surgery

## 2023-11-19 ENCOUNTER — Encounter (HOSPITAL_COMMUNITY): Payer: 59

## 2023-12-10 ENCOUNTER — Ambulatory Visit: Payer: 59 | Admitting: Vascular Surgery

## 2023-12-10 ENCOUNTER — Encounter (HOSPITAL_COMMUNITY): Payer: 59

## 2024-08-17 DIAGNOSIS — M5416 Radiculopathy, lumbar region: Secondary | ICD-10-CM | POA: Diagnosis not present

## 2024-08-17 DIAGNOSIS — Z0000 Encounter for general adult medical examination without abnormal findings: Secondary | ICD-10-CM | POA: Diagnosis not present

## 2024-08-17 DIAGNOSIS — I7409 Other arterial embolism and thrombosis of abdominal aorta: Secondary | ICD-10-CM | POA: Diagnosis not present

## 2024-08-17 DIAGNOSIS — Z23 Encounter for immunization: Secondary | ICD-10-CM | POA: Diagnosis not present

## 2024-08-17 DIAGNOSIS — N183 Chronic kidney disease, stage 3 unspecified: Secondary | ICD-10-CM | POA: Diagnosis not present

## 2024-08-17 DIAGNOSIS — Z1159 Encounter for screening for other viral diseases: Secondary | ICD-10-CM | POA: Diagnosis not present

## 2024-08-17 DIAGNOSIS — I10 Essential (primary) hypertension: Secondary | ICD-10-CM | POA: Diagnosis not present

## 2024-08-17 DIAGNOSIS — R0989 Other specified symptoms and signs involving the circulatory and respiratory systems: Secondary | ICD-10-CM | POA: Diagnosis not present

## 2024-08-17 DIAGNOSIS — E7800 Pure hypercholesterolemia, unspecified: Secondary | ICD-10-CM | POA: Diagnosis not present
